# Patient Record
Sex: Male | Born: 1970
Health system: Southern US, Community
[De-identification: ages and names within clinical notes are randomized; demographics above are authoritative.]

## PROBLEM LIST (undated history)

## (undated) DIAGNOSIS — E119 Type 2 diabetes mellitus without complications: Secondary | ICD-10-CM

---

## 2004-10-01 ENCOUNTER — Emergency Department (HOSPITAL_COMMUNITY): Admission: EM | Admit: 2004-10-01 | Discharge: 2004-10-01 | Payer: Self-pay | Admitting: Emergency Medicine

## 2005-10-21 ENCOUNTER — Emergency Department (HOSPITAL_COMMUNITY): Admission: EM | Admit: 2005-10-21 | Discharge: 2005-10-21 | Payer: Self-pay | Admitting: Emergency Medicine

## 2010-09-13 ENCOUNTER — Other Ambulatory Visit: Payer: Self-pay | Admitting: Specialist

## 2010-09-13 ENCOUNTER — Ambulatory Visit
Admission: RE | Admit: 2010-09-13 | Discharge: 2010-09-13 | Disposition: A | Discharge status: Home / Self Care | Payer: BC Managed Care – PPO | Source: Ambulatory Visit | Attending: Specialist | Admitting: Specialist

## 2010-09-13 DIAGNOSIS — M549 Dorsalgia, unspecified: Secondary | ICD-10-CM

## 2011-05-01 ENCOUNTER — Emergency Department (INDEPENDENT_AMBULATORY_CARE_PROVIDER_SITE_OTHER)
Admission: EM | Admit: 2011-05-01 | Discharge: 2011-05-01 | Disposition: A | Discharge status: Home / Self Care | Payer: BC Managed Care – PPO | Source: Home / Self Care | Attending: Emergency Medicine | Admitting: Emergency Medicine

## 2011-05-01 ENCOUNTER — Encounter (HOSPITAL_COMMUNITY): Payer: Self-pay

## 2011-05-01 DIAGNOSIS — J329 Chronic sinusitis, unspecified: Secondary | ICD-10-CM

## 2011-05-01 MED ORDER — DOXYCYCLINE HYCLATE 100 MG PO CAPS: 100.0000 mg | ORAL_CAPSULE | Freq: Two times a day (BID) | ORAL | Status: AC

## 2011-05-01 MED ORDER — FLUTICASONE PROPIONATE 50 MCG/ACT NA SUSP: 2.0000 | Freq: Every day | NASAL | Status: AC

## 2011-05-01 MED ORDER — PSEUDOEPHEDRINE-GUAIFENESIN ER 120-1200 MG PO TB12: 1.0000 | ORAL_TABLET | Freq: Two times a day (BID) | ORAL | Status: DC

## 2011-05-01 MED ORDER — HYDROCODONE-ACETAMINOPHEN 7.5-500 MG/15ML PO SOLN: 5.0000 mL | Freq: Four times a day (QID) | ORAL | Status: AC | PRN

## 2011-05-01 MED ORDER — IBUPROFEN 600 MG PO TABS: 600.0000 mg | ORAL_TABLET | Freq: Four times a day (QID) | ORAL | Status: AC | PRN

## 2011-05-01 NOTE — ED Provider Notes (Signed)
History     CSN: [id number]  Arrival date & time 05/01/11  1811   First MD Initiated Contact with Patient 05/01/11 1837      Chief Complaint  Patient presents with  . Facial Pain    (Consider location/radiation/quality/duration/timing/severity/associated sxs/prior treatment) HPI Comments: Pt with nasal congestion, postnasal drip, ST, nonproductive cough, bilateral frontal sinus pain/pressure worse with bending forward/lying down x 5 days, ear fullness/pain. C/o bodyaches, fatigue, red itchy eyes, and eye crusted shut in am. Unable to sleep at night secondary to cough and nasal congestion. No fevers, N/V, other HA,  dental pain, purulent nasal d/c. Taking otc cold meds w/o relief. No known sick contacts. Had URI 2 weeks ago but states he got completely better from this.  ROS as noted in HPI. All other ROS negative.   The history is provided by the patient.    History reviewed. No pertinent past medical history.  History reviewed. No pertinent past surgical history.  History reviewed. No pertinent family history.  History  Substance Use Topics  . Smoking status: Never Smoker   . Smokeless tobacco: Not on file  . Alcohol Use: Yes      Review of Systems  Allergies  Review of patient's allergies indicates no known allergies.  Home Medications   Current Outpatient Rx  Name Route Sig Dispense Refill  . DOXYCYCLINE HYCLATE 100 MG PO CAPS Oral Take 1 capsule (100 mg total) by mouth 2 (two) times daily. X 7 days 14 capsule 0  . FLUTICASONE PROPIONATE 50 MCG/ACT NA SUSP Nasal Place 2 sprays into the nose daily. 16 g 0  . HYDROCODONE-ACETAMINOPHEN 7.5-500 MG/15ML PO SOLN Oral Take 5 mLs by mouth every 6 (six) hours as needed for pain. 120 mL 0  . IBUPROFEN 600 MG PO TABS Oral Take 1 tablet (600 mg total) by mouth every 6 (six) hours as needed for pain. 30 tablet 0  . PSEUDOEPHEDRINE-GUAIFENESIN ER [phone] MG PO TB12 Oral Take 1 tablet by mouth 2 (two) times daily. 20 each 0     BP 134/96  Pulse 70  Temp(Src) 98.9 F (37.2 C) (Oral)  Resp 11  SpO2 99%  Physical Exam  Nursing note and vitals reviewed. Constitutional: He is oriented to person, place, and time. He appears well-developed and well-nourished.  HENT:  Head: Normocephalic and atraumatic.  Right Ear: Hearing, tympanic membrane and ear canal normal.  Left Ear: Hearing, tympanic membrane and ear canal normal.  Nose: Mucosal edema and rhinorrhea present. No epistaxis. Right sinus exhibits frontal sinus tenderness. Right sinus exhibits no maxillary sinus tenderness. Left sinus exhibits frontal sinus tenderness. Left sinus exhibits no maxillary sinus tenderness.  Mouth/Throat: Uvula is midline and mucous membranes are normal. Posterior oropharyngeal erythema present. No oropharyngeal exudate.       Purulent nasal d/c  Eyes: Conjunctivae and EOM are normal. Pupils are equal, round, and reactive to light.  Neck: Normal range of motion. Neck supple.  Cardiovascular: Normal rate, regular rhythm and normal heart sounds.   Pulmonary/Chest: Effort normal and breath sounds normal. No respiratory distress.  Abdominal: Soft. Bowel sounds are normal. He exhibits no distension. There is no tenderness.  Musculoskeletal: Normal range of motion. He exhibits no edema and no tenderness.  Lymphadenopathy:    He has no cervical adenopathy.  Neurological: He is alert and oriented to person, place, and time.  Skin: Skin is warm and dry. No rash noted.  Psychiatric: He has a normal mood and affect. His behavior is normal.  Judgment and thought content normal.    ED Course  Procedures (including critical care time)  Labs Reviewed - No data to display No results found.   1. Sinusitis       MDM  No fevers >102, has had sx for < 10 days, no h/o double sickening- states got better from prev uri. Has purulent nasal d/c. Sending home with wait and see rx for doxy. Will start flonase, mucinex-d, increase fluids, nasal  saline irrigation,  tylenol/motrin prn pain. Discussed MDM and plan with pt. Pt agrees with plan and will f/u with PMD prn.     Luiz Blare, MD 05/01/11 2137

## 2011-05-01 NOTE — ED Notes (Signed)
C/o onset ~1 week ago of URI type stx, and having pain and fullness in face, yellow nasal secretions, hard to sleep at night due to cough and congestion; hurts al over

## 2019-06-21 DIAGNOSIS — L02215 Cutaneous abscess of perineum: Secondary | ICD-10-CM

## 2019-06-21 HISTORY — DX: Cutaneous abscess of perineum: L02.215

## 2019-07-04 ENCOUNTER — Observation Stay (HOSPITAL_COMMUNITY): Payer: Self-pay | Admitting: Certified Registered"

## 2019-07-04 ENCOUNTER — Encounter (HOSPITAL_COMMUNITY): Payer: Self-pay | Admitting: Emergency Medicine

## 2019-07-04 ENCOUNTER — Inpatient Hospital Stay (HOSPITAL_COMMUNITY)
Admission: EM | Admit: 2019-07-04 | Discharge: 2019-07-06 | DRG: 603 | Disposition: A | Discharge status: Home / Self Care | Payer: Self-pay | Attending: General Surgery | Admitting: General Surgery

## 2019-07-04 ENCOUNTER — Other Ambulatory Visit: Payer: Self-pay

## 2019-07-04 ENCOUNTER — Encounter (HOSPITAL_COMMUNITY): Admission: EM | Disposition: A | Discharge status: Home / Self Care | Payer: Self-pay | Source: Home / Self Care

## 2019-07-04 ENCOUNTER — Emergency Department (HOSPITAL_COMMUNITY): Payer: Self-pay

## 2019-07-04 DIAGNOSIS — R030 Elevated blood-pressure reading, without diagnosis of hypertension: Secondary | ICD-10-CM | POA: Diagnosis present

## 2019-07-04 DIAGNOSIS — L03317 Cellulitis of buttock: Secondary | ICD-10-CM | POA: Diagnosis present

## 2019-07-04 DIAGNOSIS — Z79899 Other long term (current) drug therapy: Secondary | ICD-10-CM

## 2019-07-04 DIAGNOSIS — B9562 Methicillin resistant Staphylococcus aureus infection as the cause of diseases classified elsewhere: Secondary | ICD-10-CM | POA: Diagnosis present

## 2019-07-04 DIAGNOSIS — Z20822 Contact with and (suspected) exposure to covid-19: Secondary | ICD-10-CM | POA: Diagnosis present

## 2019-07-04 DIAGNOSIS — E1165 Type 2 diabetes mellitus with hyperglycemia: Secondary | ICD-10-CM | POA: Diagnosis present

## 2019-07-04 DIAGNOSIS — L02215 Cutaneous abscess of perineum: Principal | ICD-10-CM | POA: Diagnosis present

## 2019-07-04 DIAGNOSIS — IMO0002 Reserved for concepts with insufficient information to code with codable children: Secondary | ICD-10-CM | POA: Diagnosis present

## 2019-07-04 DIAGNOSIS — L02214 Cutaneous abscess of groin: Secondary | ICD-10-CM | POA: Diagnosis present

## 2019-07-04 DIAGNOSIS — E785 Hyperlipidemia, unspecified: Secondary | ICD-10-CM | POA: Diagnosis present

## 2019-07-04 HISTORY — PX: INCISION AND DRAINAGE ABSCESS: SHX5864

## 2019-07-04 HISTORY — DX: Type 2 diabetes mellitus without complications: E11.9

## 2019-07-04 LAB — RESPIRATORY PANEL BY RT PCR (FLU A&B, COVID)
Influenza A by PCR: NEGATIVE
Influenza B by PCR: NEGATIVE
SARS Coronavirus 2 by RT PCR: NEGATIVE

## 2019-07-04 LAB — COMPREHENSIVE METABOLIC PANEL
ALT: 18 U/L (ref 0–44)
AST: 16 U/L (ref 15–41)
Albumin: 2.6 g/dL — ABNORMAL LOW (ref 3.5–5.0)
Alkaline Phosphatase: 115 U/L (ref 38–126)
Anion gap: 12 (ref 5–15)
BUN: 8 mg/dL (ref 6–20)
CO2: 26 mmol/L (ref 22–32)
Calcium: 8.9 mg/dL (ref 8.9–10.3)
Chloride: 94 mmol/L — ABNORMAL LOW (ref 98–111)
Creatinine, Ser: 0.82 mg/dL (ref 0.61–1.24)
GFR calc Af Amer: >60 mL/min (ref >60)
GFR calc non Af Amer: >60 mL/min (ref >60)
Glucose, Bld: 339 mg/dL — ABNORMAL HIGH (ref 70–99)
Potassium: 4.3 mmol/L (ref 3.5–5.1)
Sodium: 132 mmol/L — ABNORMAL LOW (ref 135–145)
Total Bilirubin: 1 mg/dL (ref 0.3–1.2)
Total Protein: 6.9 g/dL (ref 6.5–8.1)

## 2019-07-04 LAB — CBC WITH DIFFERENTIAL/PLATELET
Abs Immature Granulocytes: 0 10*3/uL (ref 0.00–0.07)
Band Neutrophils: 1 %
Basophils Absolute: 0 10*3/uL (ref 0.0–0.1)
Basophils Relative: 0 %
Eosinophils Absolute: 0.2 10*3/uL (ref 0.0–0.5)
Eosinophils Relative: 1 %
HCT: 44 % (ref 39.0–52.0)
Hemoglobin: 15.2 g/dL (ref 13.0–17.0)
Lymphocytes Relative: 10 %
Lymphs Abs: 1.5 10*3/uL (ref 0.7–4.0)
MCH: 34.2 pg — ABNORMAL HIGH (ref 26.0–34.0)
MCHC: 34.5 g/dL (ref 30.0–36.0)
MCV: 98.9 fL (ref 80.0–100.0)
Monocytes Absolute: 1.4 10*3/uL — ABNORMAL HIGH (ref 0.1–1.0)
Monocytes Relative: 9 %
Neutro Abs: 12.2 10*3/uL — ABNORMAL HIGH (ref 1.7–7.7)
Neutrophils Relative %: 79 %
Platelets: 204 10*3/uL (ref 150–400)
RBC: 4.45 MIL/uL (ref 4.22–5.81)
RDW: 11.6 % (ref 11.5–15.5)
WBC: 15.2 10*3/uL — ABNORMAL HIGH (ref 4.0–10.5)
nRBC: 0 % (ref 0.0–0.2)
nRBC: 0 /100 WBC

## 2019-07-04 LAB — HIV ANTIBODY (ROUTINE TESTING W REFLEX): HIV Screen 4th Generation wRfx: NONREACTIVE

## 2019-07-04 LAB — CBG MONITORING, ED: Glucose-Capillary: 308 mg/dL — ABNORMAL HIGH (ref 70–99)

## 2019-07-04 LAB — GLUCOSE, CAPILLARY: Glucose-Capillary: 202 mg/dL — ABNORMAL HIGH (ref 70–99)

## 2019-07-04 SURGERY — INCISION AND DRAINAGE, ABSCESS
Anesthesia: General | Laterality: Left

## 2019-07-04 MED ORDER — ACETAMINOPHEN 500 MG PO TABS
1000.0000 mg | ORAL_TABLET | Freq: Four times a day (QID) | ORAL | Status: DC
  Administered 2019-07-05 (×5): 1000 mg via ORAL
  Administered 2019-07-06: 500 mg via ORAL
  Administered 2019-07-06: 1000 mg via ORAL
  Filled 2019-07-04 (×7): qty 2

## 2019-07-04 MED ORDER — SUGAMMADEX SODIUM 200 MG/2ML IV SOLN
INTRAVENOUS | Status: DC | PRN
  Administered 2019-07-04: 200 mg via INTRAVENOUS

## 2019-07-04 MED ORDER — INSULIN ASPART 100 UNIT/ML ~~LOC~~ SOLN
0.0000 [IU] | Freq: Every day | SUBCUTANEOUS | Status: DC
  Administered 2019-07-04: 2 [IU] via SUBCUTANEOUS

## 2019-07-04 MED ORDER — DIPHENHYDRAMINE HCL 50 MG/ML IJ SOLN: 25.0000 mg | Freq: Four times a day (QID) | INTRAMUSCULAR | Status: DC | PRN

## 2019-07-04 MED ORDER — HYDRALAZINE HCL 20 MG/ML IJ SOLN: 10.0000 mg | INTRAMUSCULAR | Status: DC | PRN

## 2019-07-04 MED ORDER — HYDROMORPHONE HCL 1 MG/ML IJ SOLN
0.5000 mg | INTRAMUSCULAR | Status: DC | PRN
  Administered 2019-07-05 – 2019-07-06 (×2): 0.5 mg via INTRAVENOUS
  Filled 2019-07-04 (×2): qty 1

## 2019-07-04 MED ORDER — LACTATED RINGERS IV SOLN: INTRAVENOUS | Status: DC | PRN

## 2019-07-04 MED ORDER — INSULIN ASPART 100 UNIT/ML ~~LOC~~ SOLN
0.0000 [IU] | Freq: Three times a day (TID) | SUBCUTANEOUS | Status: DC
  Administered 2019-07-05: 5 [IU] via SUBCUTANEOUS
  Administered 2019-07-05 (×2): 3 [IU] via SUBCUTANEOUS
  Administered 2019-07-06: 5 [IU] via SUBCUTANEOUS
  Administered 2019-07-06: 3 [IU] via SUBCUTANEOUS

## 2019-07-04 MED ORDER — METOPROLOL TARTRATE 5 MG/5ML IV SOLN: 5.0000 mg | Freq: Four times a day (QID) | INTRAVENOUS | Status: DC | PRN

## 2019-07-04 MED ORDER — DIPHENHYDRAMINE HCL 25 MG PO CAPS: 25.0000 mg | ORAL_CAPSULE | Freq: Four times a day (QID) | ORAL | Status: DC | PRN

## 2019-07-04 MED ORDER — FENTANYL CITRATE (PF) 100 MCG/2ML IJ SOLN
25.0000 ug | INTRAMUSCULAR | Status: DC | PRN
  Administered 2019-07-04 (×2): 50 ug via INTRAVENOUS

## 2019-07-04 MED ORDER — FENTANYL CITRATE (PF) 250 MCG/5ML IJ SOLN
INTRAMUSCULAR | Status: DC | PRN
  Administered 2019-07-04 (×2): 100 ug via INTRAVENOUS
  Administered 2019-07-04: 50 ug via INTRAVENOUS

## 2019-07-04 MED ORDER — SUCCINYLCHOLINE CHLORIDE 200 MG/10ML IV SOSY
PREFILLED_SYRINGE | INTRAVENOUS | Status: AC
  Filled 2019-07-04: qty 10

## 2019-07-04 MED ORDER — PIPERACILLIN-TAZOBACTAM 3.375 G IVPB 30 MIN
3.3750 g | Freq: Once | INTRAVENOUS | Status: AC
  Administered 2019-07-04: 3.375 g via INTRAVENOUS
  Filled 2019-07-04: qty 50

## 2019-07-04 MED ORDER — ACETAMINOPHEN 10 MG/ML IV SOLN: 1000.0000 mg | Freq: Once | INTRAVENOUS | Status: DC | PRN

## 2019-07-04 MED ORDER — MIDAZOLAM HCL 2 MG/2ML IJ SOLN
INTRAMUSCULAR | Status: AC
  Filled 2019-07-04: qty 2

## 2019-07-04 MED ORDER — DOCUSATE SODIUM 100 MG PO CAPS
100.0000 mg | ORAL_CAPSULE | Freq: Two times a day (BID) | ORAL | Status: DC
  Administered 2019-07-04 – 2019-07-06 (×4): 100 mg via ORAL
  Filled 2019-07-04 (×4): qty 1

## 2019-07-04 MED ORDER — BISACODYL 10 MG RE SUPP: 10.0000 mg | Freq: Every day | RECTAL | Status: DC | PRN

## 2019-07-04 MED ORDER — FENTANYL CITRATE (PF) 250 MCG/5ML IJ SOLN
INTRAMUSCULAR | Status: AC
  Filled 2019-07-04: qty 5

## 2019-07-04 MED ORDER — ROCURONIUM BROMIDE 10 MG/ML (PF) SYRINGE
PREFILLED_SYRINGE | INTRAVENOUS | Status: AC
Start: 2019-07-04 — End: ?
  Filled 2019-07-04: qty 10

## 2019-07-04 MED ORDER — LIDOCAINE 2% (20 MG/ML) 5 ML SYRINGE
INTRAMUSCULAR | Status: AC
  Filled 2019-07-04: qty 5

## 2019-07-04 MED ORDER — ROCURONIUM BROMIDE 100 MG/10ML IV SOLN
INTRAVENOUS | Status: DC | PRN
  Administered 2019-07-04: 60 mg via INTRAVENOUS

## 2019-07-04 MED ORDER — ACETAMINOPHEN 160 MG/5ML PO SOLN: 1000.0000 mg | Freq: Once | ORAL | Status: DC | PRN

## 2019-07-04 MED ORDER — ONDANSETRON HCL 4 MG/2ML IJ SOLN
INTRAMUSCULAR | Status: DC | PRN
  Administered 2019-07-04: 4 mg via INTRAVENOUS

## 2019-07-04 MED ORDER — OXYCODONE HCL 5 MG PO TABS
5.0000 mg | ORAL_TABLET | ORAL | Status: DC | PRN
  Administered 2019-07-04 – 2019-07-06 (×9): 10 mg via ORAL
  Filled 2019-07-04 (×9): qty 2

## 2019-07-04 MED ORDER — PHENYLEPHRINE 40 MCG/ML (10ML) SYRINGE FOR IV PUSH (FOR BLOOD PRESSURE SUPPORT)
PREFILLED_SYRINGE | INTRAVENOUS | Status: AC
  Filled 2019-07-04: qty 10

## 2019-07-04 MED ORDER — PIPERACILLIN-TAZOBACTAM 3.375 G IVPB
3.3750 g | Freq: Three times a day (TID) | INTRAVENOUS | Status: DC
  Administered 2019-07-04 – 2019-07-06 (×6): 3.375 g via INTRAVENOUS
  Filled 2019-07-04 (×5): qty 50

## 2019-07-04 MED ORDER — ACETAMINOPHEN 500 MG PO TABS: 1000.0000 mg | ORAL_TABLET | Freq: Once | ORAL | Status: DC | PRN

## 2019-07-04 MED ORDER — PROPOFOL 10 MG/ML IV BOLUS
INTRAVENOUS | Status: AC
  Filled 2019-07-04: qty 20

## 2019-07-04 MED ORDER — LIDOCAINE HCL (CARDIAC) PF 100 MG/5ML IV SOSY
PREFILLED_SYRINGE | INTRAVENOUS | Status: DC | PRN
  Administered 2019-07-04: 60 mg via INTRATRACHEAL

## 2019-07-04 MED ORDER — ENOXAPARIN SODIUM 40 MG/0.4ML ~~LOC~~ SOLN
40.0000 mg | SUBCUTANEOUS | Status: DC
  Administered 2019-07-05 – 2019-07-06 (×2): 40 mg via SUBCUTANEOUS
  Filled 2019-07-04 (×2): qty 0.4

## 2019-07-04 MED ORDER — OXYCODONE HCL 5 MG/5ML PO SOLN: 5.0000 mg | Freq: Once | ORAL | Status: DC | PRN

## 2019-07-04 MED ORDER — SODIUM CHLORIDE 0.9 % IV SOLN: INTRAVENOUS | Status: DC

## 2019-07-04 MED ORDER — SODIUM CHLORIDE 0.9 % IV BOLUS
1000.0000 mL | Freq: Once | INTRAVENOUS | Status: AC
  Administered 2019-07-04: 1000 mL via INTRAVENOUS

## 2019-07-04 MED ORDER — MIDAZOLAM HCL 2 MG/2ML IJ SOLN
INTRAMUSCULAR | Status: AC
Start: 2019-07-04 — End: ?
  Filled 2019-07-04: qty 2

## 2019-07-04 MED ORDER — ACETAMINOPHEN 10 MG/ML IV SOLN
INTRAVENOUS | Status: AC
  Filled 2019-07-04: qty 100

## 2019-07-04 MED ORDER — ACETAMINOPHEN 10 MG/ML IV SOLN
INTRAVENOUS | Status: DC | PRN
  Administered 2019-07-04: 1000 mg via INTRAVENOUS

## 2019-07-04 MED ORDER — ONDANSETRON 4 MG PO TBDP: 4.0000 mg | ORAL_TABLET | Freq: Four times a day (QID) | ORAL | Status: DC | PRN

## 2019-07-04 MED ORDER — PROPOFOL 10 MG/ML IV BOLUS
INTRAVENOUS | Status: DC | PRN
  Administered 2019-07-04: 160 mg via INTRAVENOUS

## 2019-07-04 MED ORDER — KETOROLAC TROMETHAMINE 15 MG/ML IJ SOLN
15.0000 mg | Freq: Four times a day (QID) | INTRAMUSCULAR | Status: DC | PRN
  Administered 2019-07-05 – 2019-07-06 (×2): 15 mg via INTRAVENOUS
  Filled 2019-07-04 (×3): qty 1

## 2019-07-04 MED ORDER — IOHEXOL 300 MG/ML  SOLN
100.0000 mL | Freq: Once | INTRAMUSCULAR | Status: AC | PRN
  Administered 2019-07-04: 100 mL via INTRAVENOUS

## 2019-07-04 MED ORDER — OXYCODONE HCL 5 MG PO TABS: 5.0000 mg | ORAL_TABLET | Freq: Once | ORAL | Status: DC | PRN

## 2019-07-04 MED ORDER — ONDANSETRON HCL 4 MG/2ML IJ SOLN
INTRAMUSCULAR | Status: AC
  Filled 2019-07-04: qty 2

## 2019-07-04 MED ORDER — ONDANSETRON HCL 4 MG/2ML IJ SOLN: 4.0000 mg | Freq: Four times a day (QID) | INTRAMUSCULAR | Status: DC | PRN

## 2019-07-04 MED ORDER — MIDAZOLAM HCL 2 MG/2ML IJ SOLN
INTRAMUSCULAR | Status: DC | PRN
  Administered 2019-07-04: 2 mg via INTRAVENOUS

## 2019-07-04 MED ORDER — FENTANYL CITRATE (PF) 100 MCG/2ML IJ SOLN
INTRAMUSCULAR | Status: AC
  Filled 2019-07-04: qty 2

## 2019-07-04 MED ORDER — MORPHINE SULFATE (PF) 2 MG/ML IV SOLN
2.0000 mg | INTRAVENOUS | Status: DC | PRN
  Administered 2019-07-04: 2 mg via INTRAVENOUS
  Filled 2019-07-04: qty 1

## 2019-07-04 SURGICAL SUPPLY — 27 items
BLADE CLIPPER SURG (BLADE) IMPLANT
BNDG GAUZE ELAST 4 BULKY (GAUZE/BANDAGES/DRESSINGS) ×2 IMPLANT
CANISTER SUCT 3000ML PPV (MISCELLANEOUS) ×3 IMPLANT
COVER SURGICAL LIGHT HANDLE (MISCELLANEOUS) ×3 IMPLANT
COVER WAND RF STERILE (DRAPES) ×1 IMPLANT
DRAIN PENROSE 1/2X12 LTX STRL (WOUND CARE) ×2 IMPLANT
DRAPE LAPAROSCOPIC ABDOMINAL (DRAPES) ×2 IMPLANT
DRAPE LAPAROTOMY 100X72 PEDS (DRAPES) IMPLANT
DRSG PAD ABDOMINAL 8X10 ST (GAUZE/BANDAGES/DRESSINGS) ×2 IMPLANT
ELECT REM PT RETURN 9FT ADLT (ELECTROSURGICAL) ×3
ELECTRODE REM PT RTRN 9FT ADLT (ELECTROSURGICAL) ×1 IMPLANT
GAUZE SPONGE 4X4 12PLY STRL (GAUZE/BANDAGES/DRESSINGS) ×2 IMPLANT
GLOVE BIO SURGEON STRL SZ 6 (GLOVE) ×3 IMPLANT
GLOVE INDICATOR 6.5 STRL GRN (GLOVE) ×3 IMPLANT
GOWN STRL REUS W/ TWL LRG LVL3 (GOWN DISPOSABLE) ×2 IMPLANT
GOWN STRL REUS W/TWL LRG LVL3 (GOWN DISPOSABLE) ×6
KIT BASIN OR (CUSTOM PROCEDURE TRAY) ×3 IMPLANT
KIT TURNOVER KIT B (KITS) ×3 IMPLANT
NS IRRIG 1000ML POUR BTL (IV SOLUTION) ×3 IMPLANT
PACK GENERAL/GYN (CUSTOM PROCEDURE TRAY) ×3 IMPLANT
PAD ARMBOARD 7.5X6 YLW CONV (MISCELLANEOUS) ×3 IMPLANT
PENCIL SMOKE EVACUATOR (MISCELLANEOUS) ×3 IMPLANT
SUT ETHILON 3 0 FSL (SUTURE) ×2 IMPLANT
SWAB COLLECTION DEVICE MRSA (MISCELLANEOUS) IMPLANT
SWAB CULTURE ESWAB REG 1ML (MISCELLANEOUS) IMPLANT
TOWEL GREEN STERILE (TOWEL DISPOSABLE) ×3 IMPLANT
TOWEL GREEN STERILE FF (TOWEL DISPOSABLE) ×3 IMPLANT

## 2019-07-04 NOTE — Op Note (Signed)
Operative Note  Mitsuo Budnick  [id number]  [id number]  07/04/2019   Surgeon: Leeroy Bock A ConnorMD  Procedure performed: Incision and debridement/drainage of left perineal abscess  Preop diagnosis: Left perineal abscess and significant cellulitis from soft tissue infection Post-op diagnosis/intraop findings: Same, no evidence of necrotizing infection  Specimens: Cultures Retained items: Penrose through the wound as well as Kerlix packing EBL: Minimal cc Complications: none  Description of procedure: After obtaining informed consent the patient was taken to the operating room and placed supine on operating room table wheregeneral endotracheal anesthesia was initiated, preoperative antibiotics were administered, SCDs applied, and a formal timeout was performed.  He was transitioned to the dorsal lithotomy position with all pressure points appropriately padded.  The perineum and bilateral medial thighs were prepped and draped in usual sterile fashion.  Fluctuant area in the left groin/perineum already had 2 areas which were draining purulent fluid, both of these were incised sharply and purulent fluid was drained.  This was cultured.  The wound was bluntly probed and the tissues surrounding the abscess appeared to have good integrity with no evidence of necrotizing infection. All loculations were evacuated.  The wound was then irrigated with warm sterile saline.  A 1 inch Penrose was threaded through the abscess cavity, exiting the 2 incisions and sutured to itself.  A damp kerlix packing was then placed in the wound.  Dry gauze, ABD dressings were then applied. The patient was then returned to the supine position, awakened, extubated and taken to PACU in stable condition.   All counts were correct at the completion of the case.

## 2019-07-04 NOTE — Transfer of Care (Signed)
Immediate Anesthesia Transfer of Care Note  Patient: Cody Thompson  Procedure(s) Performed: INCISION AND DRAINAGE  LEFT GROIN ABSCESS (Left )  Patient Location: PACU  Anesthesia Type:General  Level of Consciousness: awake and alert   Airway & Oxygen Therapy: Patient Spontanous Breathing  Post-op Assessment: Report given to RN and Post -op Vital signs reviewed and stable  Post vital signs: Reviewed and stable  Last Vitals:  Vitals Value Taken Time  BP 122/83 07/04/19 2018  Temp    Pulse 104 07/04/19 2019  Resp 27 07/04/19 2019  SpO2 93 % 07/04/19 2019  Vitals shown include unvalidated device data.  Last Pain:  Vitals:   07/04/19 1525  TempSrc:   PainSc: 10-Worst pain ever         Complications: No apparent anesthesia complications

## 2019-07-04 NOTE — ED Notes (Signed)
Patient transported to CT 

## 2019-07-04 NOTE — ED Triage Notes (Signed)
C/o abscess to groin that is gradually getting worse.

## 2019-07-04 NOTE — ED Provider Notes (Addendum)
[facility] Provider Note   CSN: [id number] Arrival date & time: 07/04/19  1454     History Chief Complaint  Patient presents with  . Groin Pain    Cody Thompson is a 49 y.o. male.  Left groin and perineal abscess for several days, getting worse.  Wound is now draining.  No fever or chills.  No known history of diabetes.  Severity is moderate.  Palpation makes pain worse        History reviewed. No pertinent past medical history.  There are no problems to display for this patient.   History reviewed. No pertinent surgical history.     No family history on file.  Social History   Tobacco Use  . Smoking status: Never Smoker  Substance Use Topics  . Alcohol use: Yes  . Drug use: No    Home Medications Prior to Admission medications   Medication Sig Start Date End Date Taking? Authorizing Provider  fluticasone (FLONASE) 50 MCG/ACT nasal spray Place 2 sprays into the nose daily. 05/01/11 04/30/12  Melynda Ripple, MD  Pseudoephedrine-Guaifenesin (MUCINEX D) [phone] MG TB12 Take 1 tablet by mouth 2 (two) times daily. 05/01/11   Melynda Ripple, MD    Allergies    Patient has no known allergies.  Review of Systems   Review of Systems  All other systems reviewed and are negative.   Physical Exam Updated Vital Signs BP (!) 140/96 (BP Location: Right Arm)   Pulse (!) 120   Temp 99.9 F (37.7 C) (Oral)   Resp 18   SpO2 99%   Physical Exam Vitals and nursing note reviewed.  Constitutional:      Appearance: He is well-developed.  HENT:     Head: Normocephalic and atraumatic.  Eyes:     Conjunctiva/sclera: Conjunctivae normal.  Cardiovascular:     Rate and Rhythm: Normal rate and regular rhythm.  Pulmonary:     Effort: Pulmonary effort is normal.     Breath sounds: Normal breath sounds.  Abdominal:     General: Bowel sounds are normal.     Palpations: Abdomen is soft.  Genitourinary:    Comments: Obvious  extensive erythema, edema, induration left medial inguinal area and perineum; draining serous/bloody drainage Musculoskeletal:        General: Normal range of motion.     Cervical back: Neck supple.  Skin:    General: Skin is warm and dry.  Neurological:     General: No focal deficit present.     Mental Status: He is alert and oriented to person, place, and time.  Psychiatric:        Behavior: Behavior normal.     ED Results / Procedures / Treatments   Labs (all labs ordered are listed, but only abnormal results are displayed) Labs Reviewed  RESPIRATORY PANEL BY RT PCR (FLU A&B, COVID)  CBC WITH DIFFERENTIAL/PLATELET  COMPREHENSIVE METABOLIC PANEL  CBG MONITORING, ED    EKG None  Radiology No results found.  Procedures Procedures (including critical care time)  Medications Ordered in ED Medications  piperacillin-tazobactam (ZOSYN) IVPB 3.375 g (3.375 g Intravenous New Bag/Given 07/04/19 1619)  sodium chloride 0.9 % bolus 1,000 mL (1,000 mLs Intravenous New Bag/Given 07/04/19 1613)    ED Course  I have reviewed the triage vital signs and the nursing notes.  Pertinent labs & imaging results that were available during my care of the patient were reviewed by me and considered in my medical decision making (see  chart for details).    MDM Rules/Calculators/A&P                      Suspect inguinal and perineal abscess.  CT abdomen and pelvis.  Intravenous piperacillin.  Consult general surgery.   1625:  Disc c Dr Fredricka Bonine  CRITICAL CARE Performed by: Donnetta Hutching Total critical care time: 30 minutes Critical care time was exclusive of separately billable procedures and treating other patients. Critical care was necessary to treat or prevent imminent or life-threatening deterioration. Critical care was time spent personally by me on the following activities: development of treatment plan with patient and/or surrogate as well as nursing, discussions with consultants,  evaluation of patient's response to treatment, examination of patient, obtaining history from patient or surrogate, ordering and performing treatments and interventions, ordering and review of laboratory studies, ordering and review of radiographic studies, pulse oximetry and re-evaluation of patient's condition. Final Clinical Impression(s) / ED Diagnoses Final diagnoses:  Perineal abscess    Rx / DC Orders ED Discharge Orders    None       Donnetta Hutching, MD 07/04/19 1600    Donnetta Hutching, MD 07/04/19 1620

## 2019-07-04 NOTE — H&P (Signed)
Surgical H&P Requesting provider: Dr. Lacinda Axon  Chief Complaint: groin pain  HPI: Otherwise healthy 49 year old man who presents to the ER today with 2 days of swelling and pain in the left groin.  Yesterday he tried to express the abscess himself, got a small amount of fluid but continued to have evolving erythema, edema and pain in the left groin extending down the left thigh.  Wound continues to drain.  Denies fever.  Denies any known history of diabetes.  He has had this once before in the same spot but was able to drain the infection himself and it resolved.  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  No Known Allergies  History reviewed. No pertinent past medical history.  History reviewed. No pertinent surgical history.  No family history on file.  Social History   Socioeconomic History  . Marital status: Married    Spouse name: Not on file  . Number of children: Not on file  . Years of education: Not on file  . Highest education level: Not on file  Occupational History  . Not on file  Tobacco Use  . Smoking status: Never Smoker  Substance and Sexual Activity  . Alcohol use: Yes  . Drug use: No  . Sexual activity: Not on file  Other Topics Concern  . Not on file  Social History Narrative  . Not on file   Social Determinants of Health   Financial Resource Strain:   . Difficulty of Paying Living Expenses:   Food Insecurity:   . Worried About Charity fundraiser in the Last Year:   . Arboriculturist in the Last Year:   Transportation Needs:   . Film/video editor (Medical):   Marland Kitchen Lack of Transportation (Non-Medical):   Physical Activity:   . Days of Exercise per Week:   . Minutes of Exercise per Session:   Stress:   . Feeling of Stress :   Social Connections:   . Frequency of Communication with Friends and Family:   . Frequency of Social Gatherings with Friends and Family:   . Attends Religious Services:   . Active Member of Clubs or  Organizations:   . Attends Archivist Meetings:   Marland Kitchen Marital Status:     No current facility-administered medications on file prior to encounter.   Current Outpatient Medications on File Prior to Encounter  Medication Sig Dispense Refill  . fluticasone (FLONASE) 50 MCG/ACT nasal spray Place 2 sprays into the nose daily. 16 g 0  . Pseudoephedrine-Guaifenesin (MUCINEX D) [phone] MG TB12 Take 1 tablet by mouth 2 (two) times daily. 20 each 0    Review of Systems: a complete, 10pt review of systems was completed with pertinent positives and negatives as documented in the HPI  Physical Exam: Vitals:   07/04/19 1455  BP: (!) 140/96  Pulse: (!) 120  Resp: 18  Temp: 99.9 F (37.7 C)  SpO2: 99%   Gen: A&Ox3, no distress  Eyes: lids and conjunctivae normal, no icterus. Pupils equally round and reactive to light.  Neck: supple without mass or thyromegaly Chest: respiratory effort is normal. No crepitus or tenderness on palpation of the chest. Breath sounds equal.  Cardiovascular: RRR with palpable distal pulses, no pedal edema Gastrointestinal: soft, nondistended, nontender. No mass, hepatomegaly or splenomegaly.  Lymphatic: no lymphadenopathy in the neck or groin Muscoloskeletal: no clubbing or cyanosis of the fingers.  Strength is symmetrical throughout.   Neuro: cranial nerves grossly  intact.  Sensation intact to light touch diffusely. Psych: appropriate mood and affect, normal insight/judgment intact  Skin: warm and dry.  In the left groin medial to the inguinal crease there is tenderness, erythema, induration and fluctuance and drainage of sanguinous purulent fluid.  Cellulitis extends cephalocaudad and there is faint erythema along the medial thigh as well without induration   CBC Latest Ref Rng & Units 07/04/2019  WBC 4.0 - 10.5 K/uL 15.2(H)  Hemoglobin 13.0 - 17.0 g/dL 30.8  Hematocrit 56.9 - 52.0 % 44.0  Platelets 150 - 400 K/uL 204    No flowsheet data found.  No  results found for: INR, PROTIME  Imaging: No results found.   A/P: 49 year old gentleman with left groin abscess.  I recommend I&D in the operating room.  We will proceed once his Covid test is resulted.  Discussed with patient the procedure, risks of bleeding, injury to structures in the area, and the certainty of an open wound which will require packing changes over the coming weeks.  Small possibility of need for repeat debridement.  Of note his blood sugar is elevated, we will check hemoglobin A1c with morning labs.  ADMISSION STATUS: To be determined and if indicated, admission orders will be placed postoperatively.     Phylliss Blakes, MD [facility], [place]  See AMION to contact appropriate on-call provider

## 2019-07-04 NOTE — Anesthesia Procedure Notes (Signed)
Procedure Name: Intubation Date/Time: 07/04/2019 7:36 PM Performed by: Molli Hazard, CRNA Pre-anesthesia Checklist: Patient identified, Emergency Drugs available, Suction available and Patient being monitored Patient Re-evaluated:Patient Re-evaluated prior to induction Oxygen Delivery Method: Circle system utilized Preoxygenation: Pre-oxygenation with 100% oxygen Induction Type: IV induction, Rapid sequence and Cricoid Pressure applied Laryngoscope Size: Miller and 2 Grade View: Grade I Tube type: Oral Tube size: 8.0 mm Number of attempts: 1 Airway Equipment and Method: Stylet Placement Confirmation: ETT inserted through vocal cords under direct vision,  positive ETCO2 and breath sounds checked- equal and bilateral Secured at: 23 cm Tube secured with: Tape Dental Injury: Teeth and Oropharynx as per pre-operative assessment

## 2019-07-04 NOTE — ED Notes (Signed)
CBG collected. Result "[id number]." EDP, Charm Barges, notified.

## 2019-07-05 ENCOUNTER — Encounter: Payer: Self-pay | Admitting: *Deleted

## 2019-07-05 LAB — CBC
HCT: 39.4 % (ref 39.0–52.0)
Hemoglobin: 13.3 g/dL (ref 13.0–17.0)
MCH: 34 pg (ref 26.0–34.0)
MCHC: 33.8 g/dL (ref 30.0–36.0)
MCV: 100.8 fL — ABNORMAL HIGH (ref 80.0–100.0)
Platelets: 196 10*3/uL (ref 150–400)
RBC: 3.91 MIL/uL — ABNORMAL LOW (ref 4.22–5.81)
RDW: 11.8 % (ref 11.5–15.5)
WBC: 13.9 10*3/uL — ABNORMAL HIGH (ref 4.0–10.5)
nRBC: 0 % (ref 0.0–0.2)

## 2019-07-05 LAB — LIPID PANEL
Cholesterol: 148 mg/dL (ref 0–200)
HDL: 21 mg/dL — ABNORMAL LOW (ref >40)
LDL Cholesterol: 90 mg/dL (ref 0–99)
Total CHOL/HDL Ratio: 7 RATIO
Triglycerides: 183 mg/dL — ABNORMAL HIGH (ref <150)
VLDL: 37 mg/dL (ref 0–40)

## 2019-07-05 LAB — BASIC METABOLIC PANEL
Anion gap: 13 (ref 5–15)
BUN: 12 mg/dL (ref 6–20)
CO2: 25 mmol/L (ref 22–32)
Calcium: 8.2 mg/dL — ABNORMAL LOW (ref 8.9–10.3)
Chloride: 95 mmol/L — ABNORMAL LOW (ref 98–111)
Creatinine, Ser: 0.78 mg/dL (ref 0.61–1.24)
GFR calc Af Amer: >60 mL/min (ref >60)
GFR calc non Af Amer: >60 mL/min (ref >60)
Glucose, Bld: 239 mg/dL — ABNORMAL HIGH (ref 70–99)
Potassium: 4.3 mmol/L (ref 3.5–5.1)
Sodium: 133 mmol/L — ABNORMAL LOW (ref 135–145)

## 2019-07-05 LAB — GLUCOSE, CAPILLARY
Glucose-Capillary: 225 mg/dL — ABNORMAL HIGH (ref 70–99)
Glucose-Capillary: 168 mg/dL — ABNORMAL HIGH (ref 70–99)
Glucose-Capillary: 173 mg/dL — ABNORMAL HIGH (ref 70–99)
Glucose-Capillary: 200 mg/dL — ABNORMAL HIGH (ref 70–99)

## 2019-07-05 LAB — HEMOGLOBIN A1C
Hgb A1c MFr Bld: 8.8 % — ABNORMAL HIGH (ref 4.8–5.6)
Mean Plasma Glucose: 205.86 mg/dL

## 2019-07-05 MED ORDER — POLYETHYLENE GLYCOL 3350 17 G PO PACK
17.0000 g | PACK | Freq: Every day | ORAL | Status: DC
  Administered 2019-07-05 – 2019-07-06 (×2): 17 g via ORAL
  Filled 2019-07-05 (×2): qty 1

## 2019-07-05 MED ORDER — LISINOPRIL 5 MG PO TABS
5.0000 mg | ORAL_TABLET | Freq: Every day | ORAL | Status: DC
  Administered 2019-07-05 – 2019-07-06 (×2): 5 mg via ORAL
  Filled 2019-07-05 (×2): qty 1

## 2019-07-05 NOTE — Discharge Instructions (Addendum)
Your current A1c (Diabetes Level) is 8.8%. Goal is to get you down to a 7% or less (which is an average of 150 all the time when you check your glucose).   Diabetes Basics  Diabetes (diabetes mellitus) is a long-term (chronic) disease. It occurs when the body does not properly use sugar (glucose) that is released from food after you eat. Diabetes may be caused by one or both of these problems:  Your pancreas does not make enough of a hormone called insulin.  Your body does not react in a normal way to insulin that it makes. Insulin lets sugars (glucose) go into cells in your body. This gives you energy. If you have diabetes, sugars cannot get into cells. This causes high blood sugar (hyperglycemia). Follow these instructions at home: How is diabetes treated? You may need to take insulin or other diabetes medicines daily to keep your blood sugar in balance. Take your diabetes medicines every day as told by your doctor. List your diabetes medicines here: Diabetes medicines  Name of medicine: ______________________________ ? Amount (dose): _______________ Time (a.m./p.m.): _______________ Notes: ___________________________________  Name of medicine: ______________________________ ? Amount (dose): _______________ Time (a.m./p.m.): _______________ Notes: ___________________________________  Name of medicine: ______________________________ ? Amount (dose): _______________ Time (a.m./p.m.): _______________ Notes: ___________________________________ If you use insulin, you will learn how to give yourself insulin by injection. You may need to adjust the amount based on the food that you eat. List the types of insulin you use here: Insulin  Insulin type: ______________________________ ? Amount (dose): _______________ Time (a.m./p.m.): _______________ Notes: ___________________________________  Insulin type: ______________________________ ? Amount (dose): _______________ Time (a.m./p.m.):  _______________ Notes: ___________________________________  Insulin type: ______________________________ ? Amount (dose): _______________ Time (a.m./p.m.): _______________ Notes: ___________________________________  Insulin type: ______________________________ ? Amount (dose): _______________ Time (a.m./p.m.): _______________ Notes: ___________________________________  Insulin type: ______________________________ ? Amount (dose): _______________ Time (a.m./p.m.): _______________ Notes: ___________________________________ How do I manage my blood sugar?  Check your blood sugar levels using a blood glucose monitor as directed by your doctor. Your doctor will set treatment goals for you. Generally, you should have these blood sugar levels:  Before meals (preprandial): 80-130 mg/dL (1.6-1.04.4-7.2 mmol/L).  After meals (postprandial): below 180 mg/dL (10 mmol/L).  A1c level: less than 7%. Write down the times that you will check your blood sugar levels: Blood sugar checks  Time: _______________ Notes: ___________________________________  Time: _______________ Notes: ___________________________________  Time: _______________ Notes: ___________________________________  Time: _______________ Notes: ___________________________________  Time: _______________ Notes: ___________________________________  Time: _______________ Notes: ___________________________________  What do I need to know about low blood sugar? Low blood sugar is called hypoglycemia. This is when blood sugar is at or below 70 mg/dL (3.9 mmol/L). Symptoms may include:  Feeling: ? Hungry. ? Worried or nervous (anxious). ? Sweaty and clammy. ? Confused. ? Dizzy. ? Sleepy. ? Sick to your stomach (nauseous).  Having: ? A fast heartbeat. ? A headache. ? A change in your vision. ? Tingling or no feeling (numbness) around the mouth, lips, or tongue. ? Jerky movements that you cannot control (seizure).  Having trouble  with: ? Moving (coordination). ? Sleeping. ? Passing out (fainting). ? Getting upset easily (irritability). Treating low blood sugar To treat low blood sugar, eat or drink something sugary right away. If you can think clearly and swallow safely, follow the 15:15 rule:  Take 15 grams of a fast-acting carb (carbohydrate). Talk with your doctor about how much you should take.  Some fast-acting carbs are: ? Sugar tablets (glucose pills). Take 3-4 glucose  pills. ? 6-8 pieces of hard candy. ? 4-6 oz (120-150 mL) of fruit juice. ? 4-6 oz (120-150 mL) of regular (not diet) soda. ? 1 Tbsp (15 mL) honey or sugar.  Check your blood sugar 15 minutes after you take the carb.  If your blood sugar is still at or below 70 mg/dL (3.9 mmol/L), take 15 grams of a carb again.  If your blood sugar does not go above 70 mg/dL (3.9 mmol/L) after 3 tries, get help right away.  After your blood sugar goes back to normal, eat a meal or a snack within 1 hour. Treating very low blood sugar If your blood sugar is at or below 54 mg/dL (3 mmol/L), you have very low blood sugar (severe hypoglycemia). This is an emergency. Do not wait to see if the symptoms will go away. Get medical help right away. Call your local emergency services (911 in the [place] Do not drive yourself to the hospital. Questions to ask your health care provider  Do I need to meet with a diabetes educator?  What equipment will I need to care for myself at home?  What diabetes medicines do I need? When should I take them?  How often do I need to check my blood sugar?  What number can I call if I have questions?  When is my next doctor's visit?  Where can I find a support group for people with diabetes? Where to find more information  American Diabetes Association: www.diabetes.org  American Association of Diabetes Educators: www.diabeteseducator.org/patient-resources Contact a doctor if:  Your blood sugar is at or above 240 mg/dL  (74.1 mmol/L) for 2 days in a row.  You have been sick or have had a fever for 2 days or more, and you are not getting better.  You have any of these problems for more than 6 hours: ? You cannot eat or drink. ? You feel sick to your stomach (nauseous). ? You throw up (vomit). ? You have watery poop (diarrhea). Get help right away if:  Your blood sugar is lower than 54 mg/dL (3 mmol/L).  You get confused.  You have trouble: ? Thinking clearly. ? Breathing. Summary  Diabetes (diabetes mellitus) is a long-term (chronic) disease. It occurs when the body does not properly use sugar (glucose) that is released from food after digestion.  Take insulin and diabetes medicines as told.  Check your blood sugar every day, as often as told.  Keep all follow-up visits as told by your doctor. This is important. This information is not intended to replace advice given to you by your health care provider. Make sure you discuss any questions you have with your health care provider. Document Revised: 12/30/2018 Document Reviewed: 07/11/2017 Elsevier Patient Education  2020 [employer] Inc[employer]   Anorectal Abscess An abscess is an infected area that contains a collection of pus. An anorectal abscess is an abscess that is near the opening of the anus or around the rectum. Without treatment, an anorectal abscess can become larger and cause other problems, such as a more serious body-wide infection or pain, especially during bowel movements. What are the causes? This condition is caused by plugged glands or an infection in one of these areas:  The anus.  The area between the anus and the scrotum in males or between the anus and the vagina in females (perineum). What increases the risk? The following factors may make you more likely to develop this condition:  Diabetes or inflammatory bowel disease.  Having a body defense system (immune system) that is weak.  Engaging in anal sex.  Having a  sexually transmitted infection (STI).  Certain kinds of cancer, such as rectal carcinoma, leukemia, or lymphoma. What are the signs or symptoms? The main symptom of this condition is pain. The pain may be a throbbing pain that gets worse during bowel movements. Other symptoms include:  Swelling and redness in the area of the abscess. The redness may go beyond the abscess and appear as a red streak on the skin.  A visible, painful lump, or a lump that can be felt when touched.  Bleeding or pus-like discharge from the area.  Fever.  General weakness.  Constipation.  Diarrhea. How is this diagnosed? This condition is diagnosed based on your medical history and a physical exam of the affected area.  This may involve examining the rectal area with a gloved hand (digital rectal exam).  Sometimes, the health care provider needs to look into the rectum using a probe, scope, or imaging test.  For women, it may require a careful vaginal exam. How is this treated? Treatment for this condition may include:  Incision and drainage surgery. This involves making an incision over the abscess to drain the pus.  Medicines, including antibiotic medicine, pain medicine, stool softeners, or laxatives. Follow these instructions at home: Medicines  Take over-the-counter and prescription medicines only as told by your health care provider.  If you were prescribed an antibiotic medicine, use it as told by your health care provider. Do not stop using the antibiotic even if you start to feel better.  Do not drive or use heavy machinery while taking prescription pain medicine. Wound care   If gauze was used in the abscess, follow instructions from your health care provider about removing or changing the gauze. It can usually be removed in 2-3 days.  Wash your hands with soap and water before you remove or change your gauze. If soap and water are not available, use hand sanitizer.  If one or more  drains were placed in the abscess cavity, be careful not to pull at them. Your health care provider will tell you how long they need to remain in place.  Check your incision area every day for signs of infection. Check for: ? More redness, swelling, or pain. ? More fluid or blood. ? Warmth. ? Pus or a bad smell. Managing pain, stiffness, and swelling   Take a sitz bath 3-4 times a day and after bowel movements. This will help reduce pain and swelling.  To relieve pain, try sitting: ? On a heating pad with the setting on low. ? On an inflatable donut-shaped cushion.  If directed, put ice on the affected area: ? Put ice in a plastic bag. ? Place a towel between your skin and the bag. ? Leave the ice on for 20 minutes, 2-3 times a day. General instructions  Follow any diet instructions given by your health care provider.  Keep all follow-up visits as told by your health care provider. This is important. Contact a health care provider if you have:  Bleeding from your incision.  Pain, swelling, or redness that does not improve or gets worse.  Trouble passing stool or urine.  Symptoms that return after treatment. Get help right away if you:  Have problems moving or using your legs.  Have severe or increasing pain.  Have swelling in the affected area that suddenly gets worse.  Have a large increase in  bleeding or passing of pus.  Develop chills or a fever. Summary  An anorectal abscess is an abscess that is near the opening of the anus or around the rectum. An abscess is an infected area that contains a collection of pus.  The main symptom of this condition is pain. It may be a throbbing pain that gets worse during bowel movements.  Treatment for an anorectal abscess may include surgery to drain the pus from the abscess. Medicines and sitz baths may also be a part of your treatment plan. This information is not intended to replace advice given to you by your health care  provider. Make sure you discuss any questions you have with your health care provider. Document Revised: 05/15/2017 Document Reviewed: 05/15/2017 Elsevier Patient Education  2020 [employer].   How to Take a [employer] A sitz bath is a warm water bath that may be used to care for your rectum, genital area, or the area between your rectum and genitals (perineum). For a sitz bath, the water only comes up to your hips and covers your buttocks. A sitz bath may done at home in a bathtub or with a portable sitz bath that fits over the toilet. Your health care provider may recommend a sitz bath to help:  Relieve pain and discomfort after delivering a baby.  Relieve pain and itching from hemorrhoids or anal fissures.  Relieve pain after certain surgeries.  Relax muscles that are sore or tight. How to take a sitz bath Take 3-4 sitz baths a day, or as many as told by your health care provider. Bathtub sitz bath To take a sitz bath in a bathtub: 1. Partially fill a bathtub with warm water. The water should be deep enough to cover your hips and buttocks when you are sitting in the tub. 2. If your health care provider told you to put medicine in the water, follow his or her instructions. 3. Sit in the water. 4. Open the tub drain a little, and leave it open during your bath. 5. Turn on the warm water again, enough to replace the water that is draining out. Keep the water running throughout your bath. This helps keep the water at the right level and the right temperature. 6. Soak in the water for 15-20 minutes, or as long as told by your health care provider. 7. When you are done, be careful when you stand up. You may feel dizzy. 8. After the sitz bath, pat yourself dry. Do not rub your skin to dry it.  Over-the-toilet sitz bath To take a sitz bath with an over-the-toilet basin: 1. Follow the manufacturer's instructions. 2. Fill the basin with warm water. 3. If your health care provider told you  to put medicine in the water, follow his or her instructions. 4. Sit on the seat. Make sure the water covers your buttocks and perineum. 5. Soak in the water for 15-20 minutes, or as long as told by your health care provider. 6. After the sitz bath, pat yourself dry. Do not rub your skin to dry it. 7. Clean and dry the basin between uses. 8. Discard the basin if it cracks, or according to the manufacturer's instructions. Contact a health care provider if:  Your symptoms get worse. Do not continue with sitz baths if your symptoms get worse.  You have new symptoms. If this happens, do not continue with sitz baths until you talk with your health care provider. Summary  A sitz bath is  a warm water bath in which the water only comes up to your hips and covers your buttocks.  A sitz bath may help relieve itching, relieve pain, and relax muscles that are sore or tight in the lower part of your body, including your genital area.  Take 3-4 sitz baths a day, or as many as told by your health care provider. Soak in the water for 15-20 minutes.  Do not continue with sitz baths if your symptoms get worse. This information is not intended to replace advice given to you by your health care provider. Make sure you discuss any questions you have with your health care provider. Document Revised: 09/07/2018 Document Reviewed: 04/10/2017 Elsevier Patient Education  [employer].

## 2019-07-05 NOTE — Progress Notes (Signed)
Spanish interpreter offered. Patient deferred.   1 Day Post-Op  Subjective: CC: Pain Patient noting moderate to severe pain over area of incision. He reports he has tolerated solid foods since surgery without n/v. No abdominal pain. He has not mobilized. He has voided.   Sugars still high this am. No hx of dm. Lives at home with his brother.   Objective: Vital signs in last 24 hours: Temp:  [97.8 F (36.6 C)-99.9 F (37.7 C)] 99.1 F (37.3 C) (03/15 0356) Pulse Rate:  [91-120] 92 (03/15 0356) Resp:  [17-24] 18 (03/15 0356) BP: (121-140)/(82-99) 122/82 (03/15 0356) SpO2:  [92 %-99 %] 99 % (03/15 0356) Weight:  [99.8 kg] 99.8 kg (03/14 2020)    Intake/Output from previous day: 03/14 0701 - 03/15 0700 In: 2160 [P.O.:360; I.V.:750; IV Piggyback:1050] Out: 825 [Urine:800; Blood:25] Intake/Output this shift: No intake/output data recorded.  PE: Gen:  Alert, NAD, pleasant Card:  RRR Pulm:  CTAB, no W/R/R, effort normal Abd: Soft, NT/ND, +BS GU: NT chaperone present. In the left groin medial to the inguinal crease there noted I&D sites x2 w/ penrose in place, with area packed with kerlix dressing. No gross purulent drainage. External dressing saturated with bloody, SS like drainage and changed. Cellulitis extends cephalocaudad and there is faint erythema along the medial thigh as well without induration Ext:  No LE edema Psych: A&Ox3  Skin: no rashes noted, warm and dry   Lab Results:  Recent Labs    07/04/19 1624 07/05/19 0346  WBC 15.2* 13.9*  HGB 15.2 13.3  HCT 44.0 39.4  PLT 204 196   BMET Recent Labs    07/04/19 1624 07/05/19 0346  NA 132* 133*  K 4.3 4.3  CL 94* 95*  CO2 26 25  GLUCOSE 339* 239*  BUN 8 12  CREATININE 0.82 0.78  CALCIUM 8.9 8.2*   PT/INR No results for input(s): LABPROT, INR in the last 72 hours. CMP     Component Value Date/Time   NA 133 (L) 07/05/2019 0346   K 4.3 07/05/2019 0346   CL 95 (L) 07/05/2019 0346   CO2 25  07/05/2019 0346   GLUCOSE 239 (H) 07/05/2019 0346   BUN 12 07/05/2019 0346   CREATININE 0.78 07/05/2019 0346   CALCIUM 8.2 (L) 07/05/2019 0346   PROT 6.9 07/04/2019 1624   ALBUMIN 2.6 (L) 07/04/2019 1624   AST 16 07/04/2019 1624   ALT 18 07/04/2019 1624   ALKPHOS 115 07/04/2019 1624   BILITOT 1.0 07/04/2019 1624   GFRNONAA >60 07/05/2019 0346   GFRAA >60 07/05/2019 0346   Lipase  No results found for: LIPASE     Studies/Results: CT ABDOMEN PELVIS W CONTRAST  Result Date: 07/04/2019 CLINICAL DATA:  Worsening left groin abscess. EXAM: CT ABDOMEN AND PELVIS WITH CONTRAST TECHNIQUE: Multidetector CT imaging of the abdomen and pelvis was performed using the standard protocol following bolus administration of intravenous contrast. CONTRAST:  OMNIPAQUE IOHEXOL 300 MG/ML  SOLN COMPARISON:  None. FINDINGS: Lower chest: No significant pulmonary nodules or acute consolidative airspace disease. Hepatobiliary: Normal liver size. No liver mass. Normal gallbladder with no radiopaque cholelithiasis. No biliary ductal dilatation. Pancreas: Normal, with no mass or duct dilation. Spleen: Normal size. No mass. Adrenals/Urinary Tract: Normal adrenals. Subcentimeter hypodense renal cortical lesion in the posterior lower right kidney, too small to characterize, requiring no follow-up. Otherwise normal kidneys, with no hydronephrosis. Normal bladder. Stomach/Bowel: Normal non-distended stomach. Normal caliber small bowel with no small bowel wall thickening. Normal  appendix. Mild left colonic diverticulosis, with no large bowel wall thickening or significant pericolonic fat stranding. Vascular/Lymphatic: Normal caliber abdominal aorta. Patent portal, splenic, hepatic and renal veins. No pathologically enlarged abdominal lymph nodes. Mildly enlarged 1.0 cm left external iliac node (series 3/image 80). Mild left inguinal lymphadenopathy up to 1.3 cm (series 3/image 91). No right pelvic adenopathy. Reproductive:  Normal size prostate with nonspecific punctate internal prostatic calcification. Other: No pneumoperitoneum. No ascites. Subcutaneous anterior left perineal 4.6 x 2.0 x 4.0 cm abscess with thick enhancing wall and prominent surrounding fat stranding and associated skin thickening (series 3/image 107), with subcutaneous edema extending inferiorly into the medial left thigh. No definite soft tissue gas. Soft tissue edema appears to involve the fascia of the left adductor musculature in the medial left thigh (series 3/image 112). Musculoskeletal: No aggressive appearing focal osseous lesions. IMPRESSION: 1. Subcutaneous anterior left perineal 4.6 x 2.0 x 4.0 cm abscess with prominent surrounding cellulitis extending into the medial left thigh with apparent involvement of the fascia of the left adductor musculature in the medial left thigh suggesting fasciitis. No definite soft tissue gas to suggest necrotizing fasciitis. 2. Mild left colonic diverticulosis. Electronically Signed   By: Ilona Sorrel M.D.   On: 07/04/2019 17:46    Anti-infectives: Anti-infectives (From admission, onward)   Start     Dose/Rate Route Frequency Ordered Stop   07/04/19 2200  piperacillin-tazobactam (ZOSYN) IVPB 3.375 g     3.375 g 12.5 mL/hr over 240 Minutes Intravenous Every 8 hours 07/04/19 2129     07/04/19 1600  piperacillin-tazobactam (ZOSYN) IVPB 3.375 g     3.375 g 100 mL/hr over 30 Minutes Intravenous  Once 07/04/19 1557 07/04/19 1646       Assessment/Plan Hyperglycemia - SSI. Await A1c.  Left perineal abscess and significant cellulitis from soft tissue infection - S/p Incision and debridement/drainage of left perineal abscess w/ Penrose through the wound as well as Kerlix packing - Dr. Kae Heller - 07/04/2019  - POD #1 - Will remove packing later today and start sitz baths - Will outline area of cellulitis - Continue IV abx. Await Cx's - Mobilize - Pulm toilet   FEN - CM diet, IVF VTE - SCDs, Lovenox ID -  Zosyn 3/14 >>     LOS: 0 days    Jillyn Ledger , [facility] Surgery 07/05/2019, 8:14 AM Please see Amion for pager number during day hours 7:00am-4:30pm

## 2019-07-05 NOTE — Progress Notes (Addendum)
Inpatient Diabetes Program Recommendations  AACE/ADA: New Consensus Statement on Inpatient Glycemic Control (2015)  Target Ranges:  Prepandial:   less than 140 mg/dL      Peak postprandial:   less than 180 mg/dL (1-2 hours)      Critically ill patients:  140 - 180 mg/dL   Lab Results  Component Value Date   GLUCAP 225 (H) 07/05/2019   HGBA1C 8.8 (H) 07/05/2019    Review of Glycemic Control  Diabetes history: None/New Diagnosis this admission  Current orders for Inpatient glycemic control:  Novolog 0-15 units tid + hs  A1c 8.8% on 3/15  Inpatient Diabetes Program Recommendations:    New diagnosis this admission. Pt may benefit from Metformin 500 mg bid at time of d/c a long with a glucose meter. Pt will need close medical follow up. Noted no insurance will need TOC team to follow and set up follow up.  Addendum 1203 pm:  spoke with pt about new DM diagnosis. Pt has a family history in his mother. Discussed his current A1c and discussed glucose and A1c goals. Discussed pt possibly being on oral DM medications and taking them with food. Also discussed how and when to check his glucose levels. Spent some time discussing lifestyle modifications with diet and exercising. Stressed importance of glucose control on wound healing. Discussed importance of follow up.   Thanks,  Christena Deem RN, MSN, BC-ADM Inpatient Diabetes Coordinator Team Pager [phone] (8a-5p)

## 2019-07-05 NOTE — Consult Note (Addendum)
[facility] Medical Consultation  Cody Thompson PZW:[id number] DOB: 1970-08-28 DOA: 07/04/2019 PCP: Patient, No Pcp Per   Requesting physician: Dr. Rodolph Bong Date of consultation: 06/07/2019 Reason for consultation: New Onset DM  Impression/Recommendations Active Problems:   Hyperglycemia   Abscess of groin, left   New Onset DM A1C 8.8 Pt does not have PCP and has no insurance coverage Agreed with sliding scale for inpatient management for now Check Lipid panel Discussed with pt about diet control, weight loss and cut down calorie intake at bedside Recommend Amaryl and Metformin to discharge home with: Amaryl 2 mg po daily Metformin 500 mg BID  Left groin abscess s/p I&D As per primary team  Leukocytosis Secondary to groin abscess  Elevated BP without diagnose of HTN Probably related to Surgical pain Recheck when pain more controlled Add small dose of lisinopril 5 mg daily for kidney protection    I will followup again tomorrow. Please contact me if I can be of assistance in the meanwhile. Thank you for this consultation.  Chief Complaint: I feel fine  HPI:  49 year old without significant medical Hx came with 2 days of swelling and pain in the left groin. He manually opened the abscess one day before with small amount of clear fluid but continued to have evolving swelling and pain in the left groin extending down the left thigh.  Wound continues to drain.  Denies fever.  On admission, blood work showed elevated Glucose and A1C 8.8, he denies any known history of diabetes. No blurry vision, no numbness or tingling of any of his limbs. Review of Systems:  Reviews of all systems negative except those mentioned in HPI  Past Medical History:  Diagnosis Date  . Diabetes mellitus without complication ([place])   . Perineal abscess 06/2019   Past Surgical History:  Procedure Laterality Date  . INCISION AND DRAINAGE ABSCESS Left 07/04/2019   Procedure: INCISION AND  DRAINAGE  LEFT GROIN ABSCESS;  Surgeon: Clovis Riley, MD;  Location: [facility];  Service: General;  Laterality: Left;   Social History:  reports that he has never smoked. He has never used smokeless tobacco. He reports current alcohol use. He reports that he does not use drugs.  No Known Allergies History reviewed. No pertinent family history.  Prior to Admission medications   Medication Sig Start Date End Date Taking? Authorizing Provider  ibuprofen (ADVIL) 200 MG tablet Take 200 mg by mouth every 6 (six) hours as needed for moderate pain.   Yes [provider]  fluticasone (FLONASE) 50 MCG/ACT nasal spray Place 2 sprays into the nose daily. 05/01/11 04/30/12  Melynda Ripple, MD  Pseudoephedrine-Guaifenesin (MUCINEX D) [phone] MG TB12 Take 1 tablet by mouth 2 (two) times daily. Patient not taking: Reported on 07/04/2019 05/01/11   Melynda Ripple, MD   Physical Exam: Blood pressure 122/82, pulse 92, temperature 99.1 F (37.3 C), temperature source Oral, resp. rate 18, height 5\' 11"  (1.803 m), weight 99.8 kg, SpO2 99 %. Vitals:   07/04/19 2355 07/05/19 0356  BP:  122/82  Pulse:  92  Resp:  18  Temp: 97.8 F (36.6 C) 99.1 F (37.3 C)  SpO2:  99%     General:  Obese, no acute distress  Eyes: PERRL  ENT: Mucous membrane moist  Neck: Supple  Cardiovascular: RRR, S1/S2, no murmurs  Respiratory: Clear B/L no crackles  Abdomen: Soft  Skin: Surgical site covered with dressing, no bleeding or discharges  Musculoskeletal: Normal muscle tone  Psychiatric: Calm  Neurologic: No focal deficit  Labs on Admission:  Basic Metabolic Panel: Recent Labs  Lab 07/04/19 1624 07/05/19 0346  NA 132* 133*  K 4.3 4.3  CL 94* 95*  CO2 26 25  GLUCOSE 339* 239*  BUN 8 12  CREATININE 0.82 0.78  CALCIUM 8.9 8.2*   Liver Function Tests: Recent Labs  Lab 07/04/19 1624  AST 16  ALT 18  ALKPHOS 115  BILITOT 1.0  PROT 6.9  ALBUMIN 2.6*   No results for input(s):  LIPASE, AMYLASE in the last 168 hours. No results for input(s): AMMONIA in the last 168 hours. CBC: Recent Labs  Lab 07/04/19 1624 07/05/19 0346  WBC 15.2* 13.9*  NEUTROABS 12.2*  --   HGB 15.2 13.3  HCT 44.0 39.4  MCV 98.9 100.8*  PLT 204 196   Cardiac Enzymes: No results for input(s): CKTOTAL, CKMB, CKMBINDEX, TROPONINI in the last 168 hours. BNP: Invalid input(s): POCBNP CBG: Recent Labs  Lab 07/04/19 1642 07/04/19 2123 07/05/19 0816 07/05/19 1201  GLUCAP 308* 202* 225* 168*    Radiological Exams on Admission: CT ABDOMEN PELVIS W CONTRAST  Result Date: 07/04/2019 CLINICAL DATA:  Worsening left groin abscess. EXAM: CT ABDOMEN AND PELVIS WITH CONTRAST TECHNIQUE: Multidetector CT imaging of the abdomen and pelvis was performed using the standard protocol following bolus administration of intravenous contrast. CONTRAST:  OMNIPAQUE IOHEXOL 300 MG/ML  SOLN COMPARISON:  None. FINDINGS: Lower chest: No significant pulmonary nodules or acute consolidative airspace disease. Hepatobiliary: Normal liver size. No liver mass. Normal gallbladder with no radiopaque cholelithiasis. No biliary ductal dilatation. Pancreas: Normal, with no mass or duct dilation. Spleen: Normal size. No mass. Adrenals/Urinary Tract: Normal adrenals. Subcentimeter hypodense renal cortical lesion in the posterior lower right kidney, too small to characterize, requiring no follow-up. Otherwise normal kidneys, with no hydronephrosis. Normal bladder. Stomach/Bowel: Normal non-distended stomach. Normal caliber small bowel with no small bowel wall thickening. Normal appendix. Mild left colonic diverticulosis, with no large bowel wall thickening or significant pericolonic fat stranding. Vascular/Lymphatic: Normal caliber abdominal aorta. Patent portal, splenic, hepatic and renal veins. No pathologically enlarged abdominal lymph nodes. Mildly enlarged 1.0 cm left external iliac node (series 3/image 80). Mild left inguinal  lymphadenopathy up to 1.3 cm (series 3/image 91). No right pelvic adenopathy. Reproductive: Normal size prostate with nonspecific punctate internal prostatic calcification. Other: No pneumoperitoneum. No ascites. Subcutaneous anterior left perineal 4.6 x 2.0 x 4.0 cm abscess with thick enhancing wall and prominent surrounding fat stranding and associated skin thickening (series 3/image 107), with subcutaneous edema extending inferiorly into the medial left thigh. No definite soft tissue gas. Soft tissue edema appears to involve the fascia of the left adductor musculature in the medial left thigh (series 3/image 112). Musculoskeletal: No aggressive appearing focal osseous lesions. IMPRESSION: 1. Subcutaneous anterior left perineal 4.6 x 2.0 x 4.0 cm abscess with prominent surrounding cellulitis extending into the medial left thigh with apparent involvement of the fascia of the left adductor musculature in the medial left thigh suggesting fasciitis. No definite soft tissue gas to suggest necrotizing fasciitis. 2. Mild left colonic diverticulosis. Electronically Signed   By: Delbert Phenix M.D.   On: 07/04/2019 17:46    EKG: None  Time spent: 35 [facility] Pager [phone]   07/05/2019, 12:51 PM

## 2019-07-06 DIAGNOSIS — L02214 Cutaneous abscess of groin: Secondary | ICD-10-CM

## 2019-07-06 DIAGNOSIS — E1165 Type 2 diabetes mellitus with hyperglycemia: Secondary | ICD-10-CM

## 2019-07-06 LAB — GLUCOSE, CAPILLARY
Glucose-Capillary: 191 mg/dL — ABNORMAL HIGH (ref 70–99)
Glucose-Capillary: 226 mg/dL — ABNORMAL HIGH (ref 70–99)

## 2019-07-06 MED ORDER — DOCUSATE SODIUM 100 MG PO CAPS: 100.0000 mg | ORAL_CAPSULE | Freq: Two times a day (BID) | ORAL | 0 refills | Status: AC | PRN

## 2019-07-06 MED ORDER — ATORVASTATIN CALCIUM 10 MG PO TABS
20.0000 mg | ORAL_TABLET | Freq: Every day | ORAL | Status: DC
  Administered 2019-07-06: 20 mg via ORAL
  Filled 2019-07-06 (×2): qty 2

## 2019-07-06 MED ORDER — ACETAMINOPHEN 500 MG PO TABS: 1000.0000 mg | ORAL_TABLET | Freq: Four times a day (QID) | ORAL | 0 refills | Status: AC

## 2019-07-06 MED ORDER — ATORVASTATIN CALCIUM 20 MG PO TABS: 20.0000 mg | ORAL_TABLET | Freq: Every day | ORAL | 0 refills | Status: AC

## 2019-07-06 MED ORDER — OXYCODONE HCL 5 MG PO TABS: 5.0000 mg | ORAL_TABLET | Freq: Four times a day (QID) | ORAL | 0 refills | Status: AC | PRN

## 2019-07-06 MED ORDER — METFORMIN HCL 500 MG PO TABS: 500.0000 mg | ORAL_TABLET | Freq: Two times a day (BID) | ORAL | 2 refills | Status: AC

## 2019-07-06 MED ORDER — INSULIN DETEMIR 100 UNIT/ML ~~LOC~~ SOLN
5.0000 [IU] | Freq: Every day | SUBCUTANEOUS | Status: DC
  Filled 2019-07-06: qty 0.05

## 2019-07-06 MED ORDER — GLIPIZIDE 5 MG PO TABS: 5.0000 mg | ORAL_TABLET | Freq: Every day | ORAL | 2 refills | Status: AC

## 2019-07-06 MED ORDER — DOXYCYCLINE HYCLATE 100 MG PO CAPS: 100.0000 mg | ORAL_CAPSULE | Freq: Two times a day (BID) | ORAL | 0 refills | Status: AC

## 2019-07-06 MED ORDER — POLYETHYLENE GLYCOL 3350 17 G PO PACK: 17.0000 g | PACK | Freq: Every day | ORAL | 0 refills | Status: AC | PRN

## 2019-07-06 MED ORDER — LISINOPRIL 5 MG PO TABS: 5.0000 mg | ORAL_TABLET | Freq: Every day | ORAL | 2 refills | Status: AC

## 2019-07-06 MED FILL — OXYCODONE HCL 5 MG TABS: 5 | 4 days supply | Qty: 15 | Fill #0

## 2019-07-06 MED FILL — glipiZIDE 5 MG TABS: 5 | 30 days supply | Qty: 30 | Fill #0

## 2019-07-06 MED FILL — LISINOPRIL 5 MG TABS: 5 | 30 days supply | Qty: 30 | Fill #0

## 2019-07-06 MED FILL — ATORVASTATIN CALCIUM 20 MG: 20 | 30 days supply | Qty: 30 | Fill #0

## 2019-07-06 MED FILL — DOXYCYCLINE HYCLATE 100 MG: 100 | 7 days supply | Qty: 14 | Fill #0

## 2019-07-06 NOTE — Social Work (Signed)
Pt appointment made at [facility] for April 12th at 2:30pm. Added to AVS as f/u provider.   Octavio Graves, MSW, LCSW [facility] Clinical Social Work

## 2019-07-06 NOTE — Progress Notes (Signed)
2 Days Post-Op  Subjective: CC: Feels much better today. Says that he can move his LLE now without pain. He is tolerating his diet without n/v. No abdominal pain. Notes some pain of the left groin/perineum when packing was removed yesterday, but not much since that time. He plans to shower this morning. He is mobilizing without difficulty and tolerating dressing changes.   Objective: Vital signs in last 24 hours: Temp:  [98.4 F (36.9 C)-99.4 F (37.4 C)] 99.4 F (37.4 C) (03/16 0418) Pulse Rate:  [87-93] 88 (03/16 0418) Resp:  [17-20] 17 (03/16 0418) BP: (102-116)/(78-83) 102/78 (03/16 0418) SpO2:  [90 %-100 %] 100 % (03/16 0418) Last BM Date: 07/04/19  Intake/Output from previous day: 03/15 0701 - 03/16 0700 In: 2119.9 [P.O.:1580; I.V.:439.9; IV Piggyback:100] Out: 1400 [Urine:1400] Intake/Output this shift: No intake/output data recorded.  PE: Gen:  Alert, NAD, pleasant Card:  RRR Pulm:  CTAB, no W/R/R, effort normal Abd: Soft, NT/ND, +BS GU: In the left groin medial to the inguinal crease there noted I&D site as well as one of the left lateral perineum buttock w/ penrose in place. Some purulent drainage notes on dressing.Cellulitis extends cephalocaudad along the medial thigh. Area of cellulitis is improving from area outlined yesterday.  Ext:  No LE edema. Able to move LLE at all joints without pain.  Psych: A&Ox3  Skin: no rashes noted, warm and dry  Lab Results:  Recent Labs    07/04/19 1624 07/05/19 0346  WBC 15.2* 13.9*  HGB 15.2 13.3  HCT 44.0 39.4  PLT 204 196   BMET Recent Labs    07/04/19 1624 07/05/19 0346  NA 132* 133*  K 4.3 4.3  CL 94* 95*  CO2 26 25  GLUCOSE 339* 239*  BUN 8 12  CREATININE 0.82 0.78  CALCIUM 8.9 8.2*   PT/INR No results for input(s): LABPROT, INR in the last 72 hours. CMP     Component Value Date/Time   NA 133 (L) 07/05/2019 0346   K 4.3 07/05/2019 0346   CL 95 (L) 07/05/2019 0346   CO2 25 07/05/2019 0346   GLUCOSE 239 (H) 07/05/2019 0346   BUN 12 07/05/2019 0346   CREATININE 0.78 07/05/2019 0346   CALCIUM 8.2 (L) 07/05/2019 0346   PROT 6.9 07/04/2019 1624   ALBUMIN 2.6 (L) 07/04/2019 1624   AST 16 07/04/2019 1624   ALT 18 07/04/2019 1624   ALKPHOS 115 07/04/2019 1624   BILITOT 1.0 07/04/2019 1624   GFRNONAA >60 07/05/2019 0346   GFRAA >60 07/05/2019 0346   Lipase  No results found for: LIPASE     Studies/Results: CT ABDOMEN PELVIS W CONTRAST  Result Date: 07/04/2019 CLINICAL DATA:  Worsening left groin abscess. EXAM: CT ABDOMEN AND PELVIS WITH CONTRAST TECHNIQUE: Multidetector CT imaging of the abdomen and pelvis was performed using the standard protocol following bolus administration of intravenous contrast. CONTRAST:  OMNIPAQUE IOHEXOL 300 MG/ML  SOLN COMPARISON:  None. FINDINGS: Lower chest: No significant pulmonary nodules or acute consolidative airspace disease. Hepatobiliary: Normal liver size. No liver mass. Normal gallbladder with no radiopaque cholelithiasis. No biliary ductal dilatation. Pancreas: Normal, with no mass or duct dilation. Spleen: Normal size. No mass. Adrenals/Urinary Tract: Normal adrenals. Subcentimeter hypodense renal cortical lesion in the posterior lower right kidney, too small to characterize, requiring no follow-up. Otherwise normal kidneys, with no hydronephrosis. Normal bladder. Stomach/Bowel: Normal non-distended stomach. Normal caliber small bowel with no small bowel wall thickening. Normal appendix. Mild left colonic diverticulosis,  with no large bowel wall thickening or significant pericolonic fat stranding. Vascular/Lymphatic: Normal caliber abdominal aorta. Patent portal, splenic, hepatic and renal veins. No pathologically enlarged abdominal lymph nodes. Mildly enlarged 1.0 cm left external iliac node (series 3/image 80). Mild left inguinal lymphadenopathy up to 1.3 cm (series 3/image 91). No right pelvic adenopathy. Reproductive: Normal size prostate  with nonspecific punctate internal prostatic calcification. Other: No pneumoperitoneum. No ascites. Subcutaneous anterior left perineal 4.6 x 2.0 x 4.0 cm abscess with thick enhancing wall and prominent surrounding fat stranding and associated skin thickening (series 3/image 107), with subcutaneous edema extending inferiorly into the medial left thigh. No definite soft tissue gas. Soft tissue edema appears to involve the fascia of the left adductor musculature in the medial left thigh (series 3/image 112). Musculoskeletal: No aggressive appearing focal osseous lesions. IMPRESSION: 1. Subcutaneous anterior left perineal 4.6 x 2.0 x 4.0 cm abscess with prominent surrounding cellulitis extending into the medial left thigh with apparent involvement of the fascia of the left adductor musculature in the medial left thigh suggesting fasciitis. No definite soft tissue gas to suggest necrotizing fasciitis. 2. Mild left colonic diverticulosis. Electronically Signed   By: Ilona Sorrel M.D.   On: 07/04/2019 17:46    Anti-infectives: Anti-infectives (From admission, onward)   Start     Dose/Rate Route Frequency Ordered Stop   07/04/19 2200  piperacillin-tazobactam (ZOSYN) IVPB 3.375 g     3.375 g 12.5 mL/hr over 240 Minutes Intravenous Every 8 hours 07/04/19 2129     07/04/19 1600  piperacillin-tazobactam (ZOSYN) IVPB 3.375 g     3.375 g 100 mL/hr over 30 Minutes Intravenous  Once 07/04/19 1557 07/04/19 1646       Assessment/Plan New onset DM2- SSI while inpatient. A1c 8.8. Appreciate TRH and DM coordinator asssitance. Recommend Metformin and Amaryl at d/c. Also recommending Lisinopril for slightly elevated blood pressures. TOC has put in for PCP ([facility]).   Left perineal abscess and significant cellulitis from soft tissue infection - S/p Incision and debridement/drainage of left perineal abscess w/ Penrose through the wound as well as Kerlix packing - Dr. Kae Heller - 07/04/2019  - POD #2 -  Packing removed POD #1. Penrose to stay in place - Start sitz baths. Patient may shower  - Cellulitis improving  - Continue IV abx while inpateint. Await Cx's s/s - Mobilize - Pulm toilet   FEN - CM diet, IVF VTE - SCDs, Lovenox ID - Zosyn 3/14 >>  Plan: Up to shower this morning. Patient voiding well, tolerating diet, ambulating well, pain well controlled, vital signs stable, cellulitis improving. Will discuss with MD about possible d/c this am. Spoke with [facility] pharmacy who recommended doxy based on current cultures.     LOS: 1 day    Jillyn Ledger , [facility] 07/06/2019, 8:22 AM Please see Amion for pager number during day hours 7:00am-4:30pm

## 2019-07-06 NOTE — Discharge Summary (Signed)
[facility] Discharge Summary   Patient ID: Cody Thompson MRN: [id number] DOB/AGE: 47/16/1972 47 y.o.  Admit date: 07/04/2019 Discharge date: 07/06/2019  Admitting Diagnosis: Left perineal abscess and significant cellulitis from soft tissue infection  Discharge Diagnosis Patient Active Problem List   Diagnosis Date Noted  . Diabetes mellitus type 2, uncontrolled ([facility]) 07/04/2019  . Abscess of groin, left 07/04/2019    Consultants Internal medicine  Imaging: CT ABDOMEN PELVIS W CONTRAST  Result Date: 07/04/2019 CLINICAL DATA:  Worsening left groin abscess. EXAM: CT ABDOMEN AND PELVIS WITH CONTRAST TECHNIQUE: Multidetector CT imaging of the abdomen and pelvis was performed using the standard protocol following bolus administration of intravenous contrast. CONTRAST:  OMNIPAQUE IOHEXOL 300 MG/ML  SOLN COMPARISON:  None. FINDINGS: Lower chest: No significant pulmonary nodules or acute consolidative airspace disease. Hepatobiliary: Normal liver size. No liver mass. Normal gallbladder with no radiopaque cholelithiasis. No biliary ductal dilatation. Pancreas: Normal, with no mass or duct dilation. Spleen: Normal size. No mass. Adrenals/Urinary Tract: Normal adrenals. Subcentimeter hypodense renal cortical lesion in the posterior lower right kidney, too small to characterize, requiring no follow-up. Otherwise normal kidneys, with no hydronephrosis. Normal bladder. Stomach/Bowel: Normal non-distended stomach. Normal caliber small bowel with no small bowel wall thickening. Normal appendix. Mild left colonic diverticulosis, with no large bowel wall thickening or significant pericolonic fat stranding. Vascular/Lymphatic: Normal caliber abdominal aorta. Patent portal, splenic, hepatic and renal veins. No pathologically enlarged abdominal lymph nodes. Mildly enlarged 1.0 cm left external iliac node (series 3/image 80). Mild left inguinal lymphadenopathy up to 1.3 cm (series 3/image 91). No  right pelvic adenopathy. Reproductive: Normal size prostate with nonspecific punctate internal prostatic calcification. Other: No pneumoperitoneum. No ascites. Subcutaneous anterior left perineal 4.6 x 2.0 x 4.0 cm abscess with thick enhancing wall and prominent surrounding fat stranding and associated skin thickening (series 3/image 107), with subcutaneous edema extending inferiorly into the medial left thigh. No definite soft tissue gas. Soft tissue edema appears to involve the fascia of the left adductor musculature in the medial left thigh (series 3/image 112). Musculoskeletal: No aggressive appearing focal osseous lesions. IMPRESSION: 1. Subcutaneous anterior left perineal 4.6 x 2.0 x 4.0 cm abscess with prominent surrounding cellulitis extending into the medial left thigh with apparent involvement of the fascia of the left adductor musculature in the medial left thigh suggesting fasciitis. No definite soft tissue gas to suggest necrotizing fasciitis. 2. Mild left colonic diverticulosis. Electronically Signed   By: Delbert Phenix M.D.   On: 07/04/2019 17:46    Procedures Dr. Fredricka Bonine (07/04/2019) - Incision and debridement/drainage of left perineal abscess   Hospital Course:  Cody Thompson is a 49yo male who presented to [facility] 3/14 with 2 days of swelling and pain in the left groin.  He had a CT scan that revealed left perineal abscess with significant cellulitis.  Patient was admitted and underwent procedure listed above.  Tolerated procedure well and was transferred to the floor.  He was kept on IV zosyn during admission and transitioned to doxycycline at discharge. A1c noted to be elevated at 8.8 this admission therefore internal medicine was consulted for assistance with management on new onset diabetes. On POD2, the patient was tolerating diet, ambulating well, pain well controlled, vital signs stable and felt stable for discharge home.  Patient will follow up as below and knows to call with questions or  concerns.    I have personally reviewed the patients medication history on the [place] controlled substance database.  Allergies as of 07/06/2019   No Known Allergies     Medication List    STOP taking these medications   ibuprofen 200 MG tablet Commonly known as: ADVIL   Pseudoephedrine-Guaifenesin [phone] MG Tb12 Commonly known as: Mucinex D     TAKE these medications   acetaminophen 500 MG tablet Commonly known as: TYLENOL Take 2 tablets (1,000 mg total) by mouth every 6 (six) hours.   atorvastatin 20 MG tablet Commonly known as: LIPITOR Take 1 tablet (20 mg total) by mouth daily at 6 PM. Start taking on: July 07, 2019   docusate sodium 100 MG capsule Commonly known as: COLACE Take 1 capsule (100 mg total) by mouth 2 (two) times daily as needed for mild constipation.   doxycycline 100 MG capsule Commonly known as: VIBRAMYCIN Take 1 capsule (100 mg total) by mouth 2 (two) times daily.   fluticasone 50 MCG/ACT nasal spray Commonly known as: FLONASE Place 2 sprays into the nose daily.   glipiZIDE 5 MG tablet Commonly known as: GLUCOTROL Take 1 tablet (5 mg total) by mouth daily before breakfast.   lisinopril 5 MG tablet Commonly known as: ZESTRIL Take 1 tablet (5 mg total) by mouth daily. Start taking on: July 07, 2019   metFORMIN 500 MG tablet Commonly known as: Glucophage Take 1 tablet (500 mg total) by mouth 2 (two) times daily with a meal.   oxyCODONE 5 MG immediate release tablet Commonly known as: Oxy IR/ROXICODONE Take 1 tablet (5 mg total) by mouth every 6 (six) hours as needed for breakthrough pain.   polyethylene glycol 17 g packet Commonly known as: MIRALAX / GLYCOLAX Take 17 g by mouth daily as needed.        Follow-up Information    [facility]. Go on 08/02/2019.   Why: 2:30pm appointment, please wear a mask.  Contact information: [place] [id number] [phone]        [facility], [place]. Go on 07/20/2019.   Specialty: General Surgery Why: at 10am. Please arrive 30 minutes prior to your appointment for paperwork. Please bring a copy of your photo ID and insurance card.  Contact information: [address] [place] [address] [phone]           Signed: [place], [facility] 07/06/2019, 2:47 PM Please see Amion for pager number during day hours 7:00am-4:30pm

## 2019-07-06 NOTE — Progress Notes (Signed)
[facility] CONSULT PROGRESS NOTE    Progress Note  Cody Thompson  GYB:[id number] DOB: 1971/02/05 DOA: 07/04/2019 PCP: Patient, No Pcp Per     Brief Narrative:   Cody Thompson is an 49 y.o. male past medical history of diabetes mellitus type 2 comes in for swelling and pain in the left groin he manually open abscess 1 day prior to admission and a small amount of clear fluid but the swelling and the redness continued to extend to the left thigh. Assessment/Plan:   Left perineal abscess/cellulitis of the left gluteus: Status post I&D on 07/04/2019 Penrose drain in place. Culture data is pending, continue IV empiric antibiotics per primary team.  New onset Diabetes mellitus type 2, uncontrolled: With an A1c of 8.8.  Patient has no PCP. He was started on sliding scale insulin, his blood glucose remains elevated. We will start him on long-acting insulin continue CBGs before meals and at bedtime. If there is no further surgical intervention or no need for further imaging. We will recommend changing him to Metformin 500 mg p.o. twice daily and glipizide 5 mg daily, he can continue these as an outpatient.  Pseudohyponatremia Likely due to hyperglycemia, resolved with correction of blood glucose.  Dyslipidemia: Start him on Lipitor.  DVT prophylaxis: lovenox Family Communication:none Disposition Plan/Barrier to D/C:   Code Status:     Code Status Orders  (From admission, onward)         Start     Ordered   07/04/19 2130  Full code  Continuous     07/04/19 2129        Code Status History    This patient has a current code status but no historical code status.   Advance Care Planning Activity        IV Access:    Peripheral IV   Procedures and diagnostic studies:   CT ABDOMEN PELVIS W CONTRAST  Result Date: 07/04/2019 CLINICAL DATA:  Worsening left groin abscess. EXAM: CT ABDOMEN AND PELVIS WITH CONTRAST TECHNIQUE: Multidetector CT imaging of the abdomen  and pelvis was performed using the standard protocol following bolus administration of intravenous contrast. CONTRAST:  OMNIPAQUE IOHEXOL 300 MG/ML  SOLN COMPARISON:  None. FINDINGS: Lower chest: No significant pulmonary nodules or acute consolidative airspace disease. Hepatobiliary: Normal liver size. No liver mass. Normal gallbladder with no radiopaque cholelithiasis. No biliary ductal dilatation. Pancreas: Normal, with no mass or duct dilation. Spleen: Normal size. No mass. Adrenals/Urinary Tract: Normal adrenals. Subcentimeter hypodense renal cortical lesion in the posterior lower right kidney, too small to characterize, requiring no follow-up. Otherwise normal kidneys, with no hydronephrosis. Normal bladder. Stomach/Bowel: Normal non-distended stomach. Normal caliber small bowel with no small bowel wall thickening. Normal appendix. Mild left colonic diverticulosis, with no large bowel wall thickening or significant pericolonic fat stranding. Vascular/Lymphatic: Normal caliber abdominal aorta. Patent portal, splenic, hepatic and renal veins. No pathologically enlarged abdominal lymph nodes. Mildly enlarged 1.0 cm left external iliac node (series 3/image 80). Mild left inguinal lymphadenopathy up to 1.3 cm (series 3/image 91). No right pelvic adenopathy. Reproductive: Normal size prostate with nonspecific punctate internal prostatic calcification. Other: No pneumoperitoneum. No ascites. Subcutaneous anterior left perineal 4.6 x 2.0 x 4.0 cm abscess with thick enhancing wall and prominent surrounding fat stranding and associated skin thickening (series 3/image 107), with subcutaneous edema extending inferiorly into the medial left thigh. No definite soft tissue gas. Soft tissue edema appears to involve the fascia of the left adductor musculature in the medial left  thigh (series 3/image 112). Musculoskeletal: No aggressive appearing focal osseous lesions. IMPRESSION: 1. Subcutaneous anterior left perineal 4.6  x 2.0 x 4.0 cm abscess with prominent surrounding cellulitis extending into the medial left thigh with apparent involvement of the fascia of the left adductor musculature in the medial left thigh suggesting fasciitis. No definite soft tissue gas to suggest necrotizing fasciitis. 2. Mild left colonic diverticulosis. Electronically Signed   By: Delbert Phenix M.D.   On: 07/04/2019 17:46     Medical Consultants:    None.  Anti-Infectives:   IV zosyn  Subjective:    Cody Thompson has no new complaints.  Objective:    Vitals:   07/05/19 1448 07/05/19 2151 07/06/19 0418 07/06/19 0852  BP: 116/83 109/82 102/78 125/77  Pulse: 93 87 88   Resp: 20 17 17    Temp: 98.8 F (37.1 C) 98.4 F (36.9 C) 99.4 F (37.4 C)   TempSrc: Oral Oral Oral   SpO2: 95% 90% 100%   Weight:      Height:       SpO2: 100 % O2 Flow Rate (L/min): 2 L/min   Intake/Output Summary (Last 24 hours) at 07/06/2019 0952 Last data filed at 07/06/2019 0857 Gross per 24 hour  Intake 1879.87 ml  Output 2400 ml  Net -520.13 ml   Filed Weights   07/04/19 2020  Weight: 99.8 kg    Exam: General exam: In no acute distress. Respiratory system: Good air movement and clear to auscultation. Cardiovascular system: S1 & S2 heard, RRR. No JVD. Gastrointestinal system: Abdomen is nondistended, soft and nontender.  Central nervous system: Alert and oriented. No focal neurological deficits. Extremities: No pedal edema. Skin: No rashes, lesions or ulcers Psychiatry: Judgement and insight appear normal. Mood & affect appropriate. Data Reviewed:    Labs: Basic Metabolic Panel: Recent Labs  Lab 07/04/19 1624 07/05/19 0346  NA 132* 133*  K 4.3 4.3  CL 94* 95*  CO2 26 25  GLUCOSE 339* 239*  BUN 8 12  CREATININE 0.82 0.78  CALCIUM 8.9 8.2*   GFR Estimated Creatinine Clearance: 138.9 mL/min (by C-G formula based on SCr of 0.78 mg/dL). Liver Function Tests: Recent Labs  Lab 07/04/19 1624  AST 16  ALT 18    ALKPHOS 115  BILITOT 1.0  PROT 6.9  ALBUMIN 2.6*   No results for input(s): LIPASE, AMYLASE in the last 168 hours. No results for input(s): AMMONIA in the last 168 hours. Coagulation profile No results for input(s): INR, PROTIME in the last 168 hours. COVID-19 Labs  No results for input(s): DDIMER, FERRITIN, LDH, CRP in the last 72 hours.  Lab Results  Component Value Date   SARSCOV2NAA NEGATIVE 07/04/2019    CBC: Recent Labs  Lab 07/04/19 1624 07/05/19 0346  WBC 15.2* 13.9*  NEUTROABS 12.2*  --   HGB 15.2 13.3  HCT 44.0 39.4  MCV 98.9 100.8*  PLT 204 196   Cardiac Enzymes: No results for input(s): CKTOTAL, CKMB, CKMBINDEX, TROPONINI in the last 168 hours. BNP (last 3 results) No results for input(s): PROBNP in the last 8760 hours. CBG: Recent Labs  Lab 07/05/19 0816 07/05/19 1201 07/05/19 1727 07/05/19 2150 07/06/19 0758  GLUCAP 225* 168* 173* 200* 191*   D-Dimer: No results for input(s): DDIMER in the last 72 hours. Hgb A1c: Recent Labs    07/05/19 0346  HGBA1C 8.8*   Lipid Profile: Recent Labs    07/05/19 0346  CHOL 148  HDL 21*  LDLCALC 90  TRIG  183*  CHOLHDL 7.0   Thyroid function studies: No results for input(s): TSH, T4TOTAL, T3FREE, THYROIDAB in the last 72 hours.  Invalid input(s): FREET3 Anemia work up: No results for input(s): VITAMINB12, FOLATE, FERRITIN, TIBC, IRON, RETICCTPCT in the last 72 hours. Sepsis Labs: Recent Labs  Lab 07/04/19 1624 07/05/19 0346  WBC 15.2* 13.9*   Microbiology Recent Results (from the past 240 hour(s))  Respiratory Panel by RT PCR (Flu A&B, Covid) - Nasopharyngeal Swab     Status: None   Collection Time: 07/04/19  4:28 PM   Specimen: Nasopharyngeal Swab  Result Value Ref Range Status   SARS Coronavirus 2 by RT PCR NEGATIVE NEGATIVE Final    Comment: (NOTE) SARS-CoV-2 target nucleic acids are NOT DETECTED. The SARS-CoV-2 RNA is generally detectable in upper respiratoy specimens during the  acute phase of infection. The lowest concentration of SARS-CoV-2 viral copies this assay can detect is 131 copies/mL. A negative result does not preclude SARS-Cov-2 infection and should not be used as the sole basis for treatment or other patient management decisions. A negative result may occur with  improper specimen collection/handling, submission of specimen other than nasopharyngeal swab, presence of viral mutation(s) within the areas targeted by this assay, and inadequate number of viral copies (<131 copies/mL). A negative result must be combined with clinical observations, patient history, and epidemiological information. The expected result is Negative. Fact Sheet for Patients:  PinkCheek.be Fact Sheet for Healthcare Providers:  GravelBags.it This test is not yet ap proved or cleared by the [place] FDA and  has been authorized for detection and/or diagnosis of SARS-CoV-2 by FDA under an Emergency Use Authorization (EUA). This EUA will remain  in effect (meaning this test can be used) for the duration of the COVID-19 declaration under Section 564(b)(1) of the Act, 21 U.S.C. section 360bbb-3(b)(1), unless the authorization is terminated or revoked sooner.    Influenza A by PCR NEGATIVE NEGATIVE Final   Influenza B by PCR NEGATIVE NEGATIVE Final    Comment: (NOTE) The Xpert Xpress SARS-CoV-2/FLU/RSV assay is intended as an aid in  the diagnosis of influenza from Nasopharyngeal swab specimens and  should not be used as a sole basis for treatment. Nasal washings and  aspirates are unacceptable for Xpert Xpress SARS-CoV-2/FLU/RSV  testing. Fact Sheet for Patients: PinkCheek.be Fact Sheet for Healthcare Providers: GravelBags.it This test is not yet approved or cleared by the [place] FDA and  has been authorized for detection and/or diagnosis of SARS-CoV-2  by  FDA under an Emergency Use Authorization (EUA). This EUA will remain  in effect (meaning this test can be used) for the duration of the  Covid-19 declaration under Section 564(b)(1) of the Act, 21  U.S.C. section 360bbb-3(b)(1), unless the authorization is  terminated or revoked. Performed at [facility], [place] [address], [place], [place] [address]   Aerobic/Anaerobic Culture (surgical/deep wound)     Status: None (Preliminary result)   Collection Time: 07/04/19  8:04 PM   Specimen: Abscess  Result Value Ref Range Status   Specimen Description ABSCESS LEFT GROIN  Final   Special Requests   Final    NONE Performed at [facility], [place] [address], [place], [place] [address]    Gram Stain   Final    ABUNDANT WBC PRESENT,BOTH PMN AND MONONUCLEAR ABUNDANT GRAM POSITIVE COCCI    Culture   Final    ABUNDANT STAPHYLOCOCCUS AUREUS CULTURE REINCUBATED FOR BETTER GROWTH    Report Status PENDING  Incomplete     Medications:   . acetaminophen  1,000 mg Oral Q6H  . docusate sodium  100 mg Oral BID  . enoxaparin (LOVENOX) injection  40 mg Subcutaneous Q24H  . insulin aspart  0-15 Units Subcutaneous TID WC  . insulin aspart  0-5 Units Subcutaneous QHS  . lisinopril  5 mg Oral Daily  . polyethylene glycol  17 g Oral Daily   Continuous Infusions: . sodium chloride 10 mL/hr at 07/06/19 0215  . piperacillin-tazobactam (ZOSYN)  IV 3.375 g (07/06/19 0550)      LOS: 1 day   Marinda Elk  [facility]  07/06/2019, 9:52 AM

## 2019-07-09 ENCOUNTER — Encounter: Payer: Self-pay | Admitting: Anesthesiology

## 2019-07-09 NOTE — Anesthesia Preprocedure Evaluation (Signed)
Anesthesia Evaluation  Patient identified by MRN, date of birth, ID band Patient awake    Reviewed: Allergy & Precautions, NPO status , Patient's Chart, lab work & pertinent test results  Airway Mallampati: II  TM Distance: >3 FB Neck ROM: Full    Dental  (+) Dental Advisory Given   Pulmonary neg pulmonary ROS, neg recent URI,    breath sounds clear to auscultation       Cardiovascular negative cardio ROS   Rhythm:Regular     Neuro/Psych negative neurological ROS  negative psych ROS   GI/Hepatic negative GI ROS, Neg liver ROS,   Endo/Other  negative endocrine ROSdiabetes  Renal/GU negative Renal ROS     Musculoskeletal negative musculoskeletal ROS (+)   Abdominal   Peds  Hematology negative hematology ROS (+)   Anesthesia Other Findings   Reproductive/Obstetrics                             Anesthesia Physical Anesthesia Plan  ASA: I  Anesthesia Plan: General   Post-op Pain Management:    Induction: Intravenous  PONV Risk Score and Plan: 2 and Ondansetron and Dexamethasone  Airway Management Planned: Oral ETT and LMA  Additional Equipment: None  Intra-op Plan:   Post-operative Plan: Extubation in OR  Informed Consent: I have reviewed the patients History and Physical, chart, labs and discussed the procedure including the risks, benefits and alternatives for the proposed anesthesia with the patient or authorized representative who has indicated his/her understanding and acceptance.     Dental advisory given  Plan Discussed with: CRNA and Surgeon  Anesthesia Plan Comments:         Anesthesia Quick Evaluation

## 2019-07-09 NOTE — Anesthesia Postprocedure Evaluation (Signed)
Anesthesia Post Note  Patient: Laiken Nohr  Procedure(s) Performed: INCISION AND DRAINAGE  LEFT GROIN ABSCESS (Left )     Patient location during evaluation: PACU Anesthesia Type: General Level of consciousness: awake and alert Pain management: pain level controlled Vital Signs Assessment: post-procedure vital signs reviewed and stable Respiratory status: spontaneous breathing, nonlabored ventilation, respiratory function stable and patient connected to nasal cannula oxygen Cardiovascular status: blood pressure returned to baseline and stable Postop Assessment: no apparent nausea or vomiting Anesthetic complications: no    Last Vitals:  Vitals:   07/06/19 0852 07/06/19 1535  BP: 125/77 118/77  Pulse:  79  Resp:  18  Temp:  36.7 C  SpO2:  97%    Last Pain:  Vitals:   07/06/19 1535  TempSrc: Oral  PainSc:                  Tyrika Newman

## 2019-07-11 LAB — AEROBIC/ANAEROBIC CULTURE W GRAM STAIN (SURGICAL/DEEP WOUND)

## 2019-08-02 ENCOUNTER — Ambulatory Visit: Payer: Self-pay | Attending: Nurse Practitioner | Admitting: Nurse Practitioner

## 2019-08-02 ENCOUNTER — Other Ambulatory Visit: Payer: Self-pay

## 2020-11-30 IMAGING — CT CT ABD-PELV W/ CM
2 of 5 series · 15 of 46 positions shown, 17 images · IV contrast (APPLIED)
Comparison: None.

CLINICAL DATA: Worsening left groin abscess.

EXAM:
CT ABDOMEN AND PELVIS WITH CONTRAST
TECHNIQUE: Multidetector CT imaging of the abdomen and pelvis was performed
using the standard protocol following bolus administration of
intravenous contrast.
CONTRAST:  100mL OMNIPAQUE IOHEXOL 300 MG/ML  SOLN

[Series 3: abdomen 5.0 · axial · 0.83mm/px · z∈[-635,-40]mm · 12 of 135 slices shown, 14 images]
[im 8/135  soft-tissue]
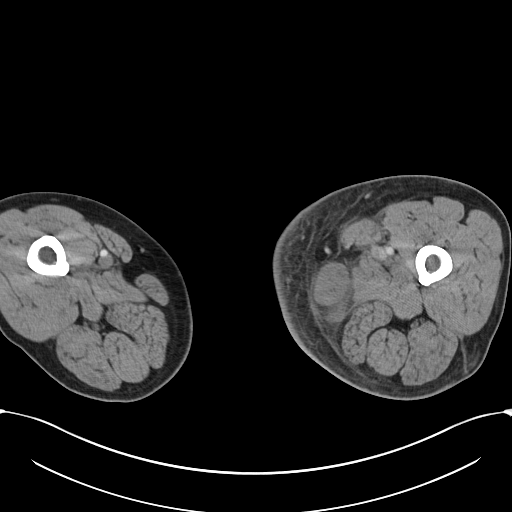
[im 8/135  bone]
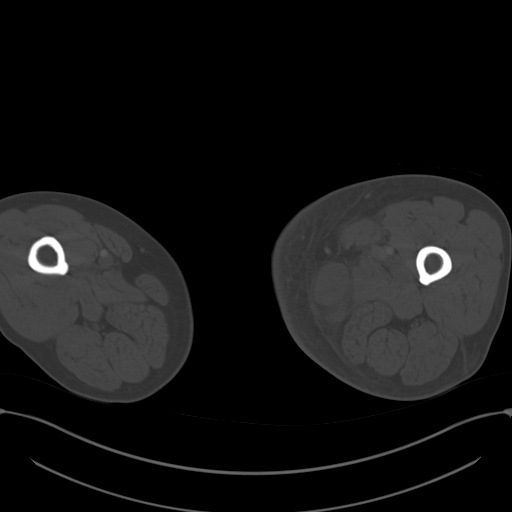
[im 24/135  soft-tissue]
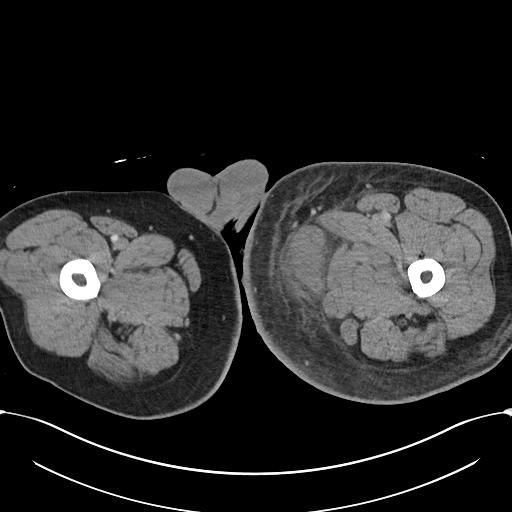
[im 32/135  soft-tissue]
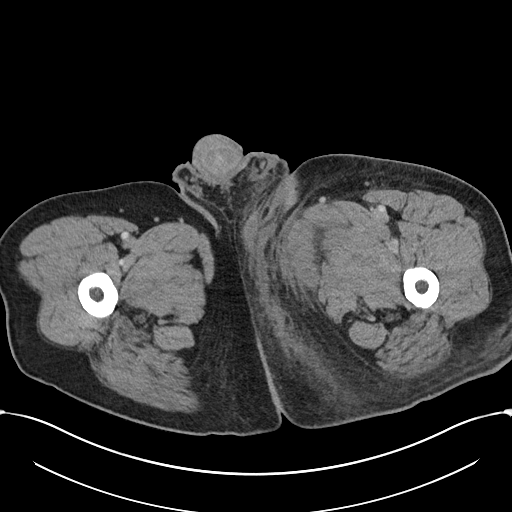
[im 40/135  soft-tissue]
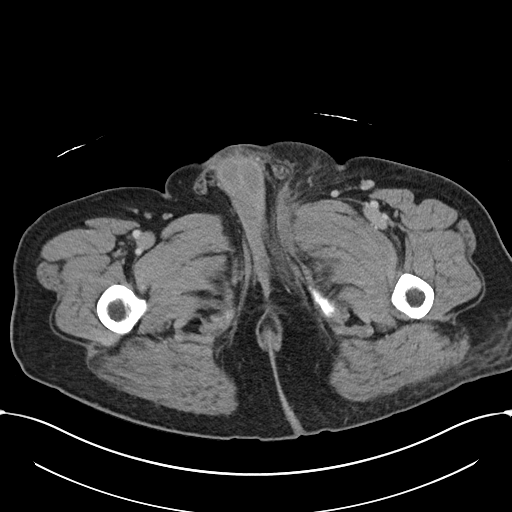
[im 56/135  soft-tissue]
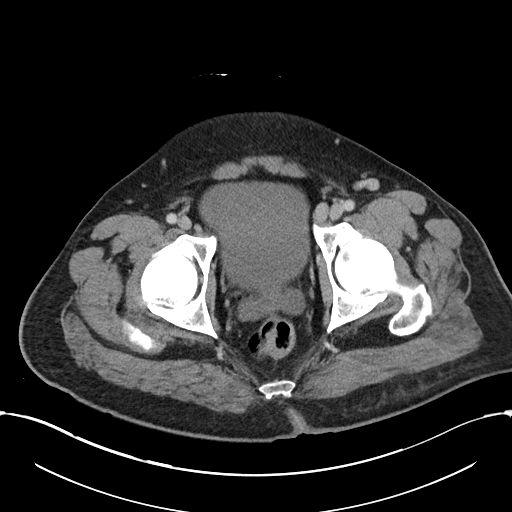
[im 64/135  soft-tissue]
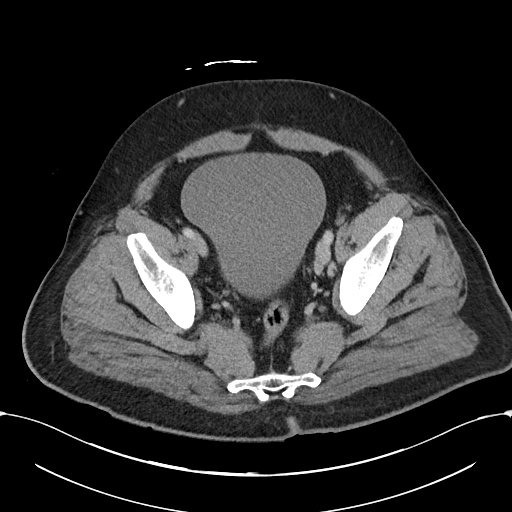
[im 71/135  soft-tissue]
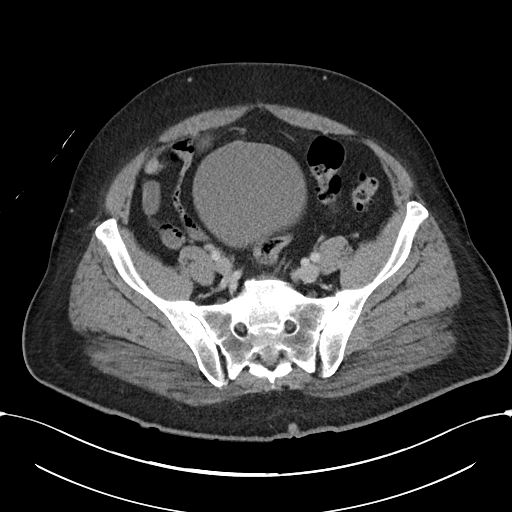
[im 87/135  soft-tissue]
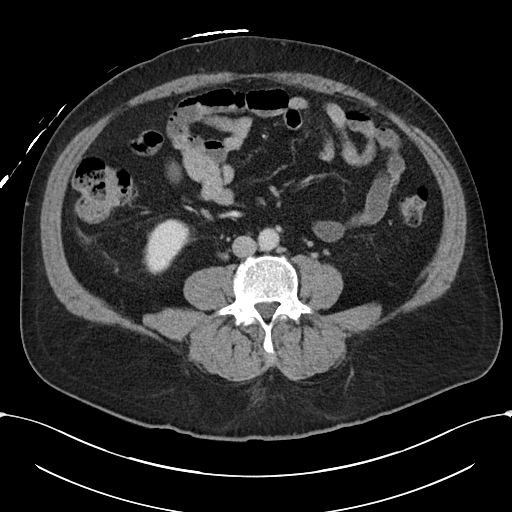
[im 95/135  soft-tissue]
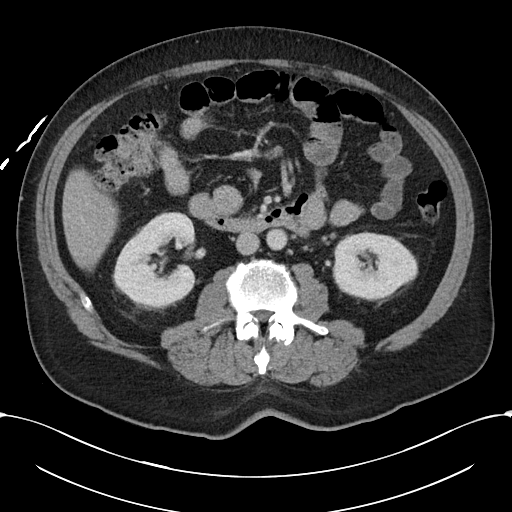
[im 95/135  bone]
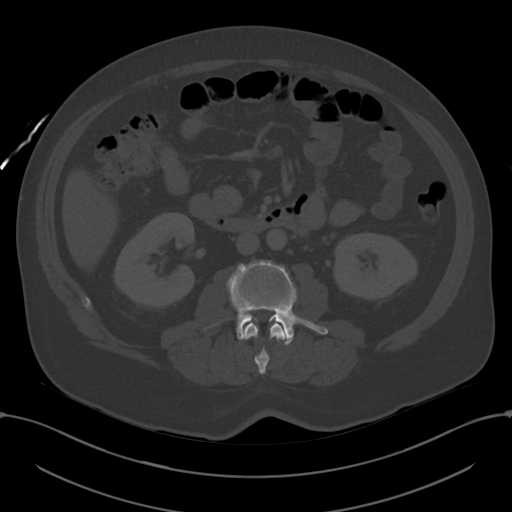
[im 103/135  soft-tissue]
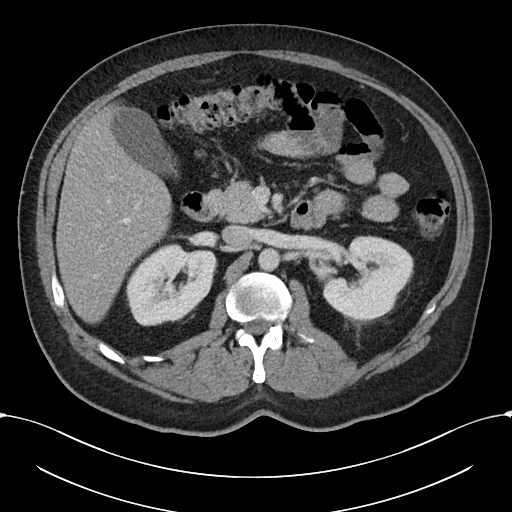
[im 119/135  soft-tissue]
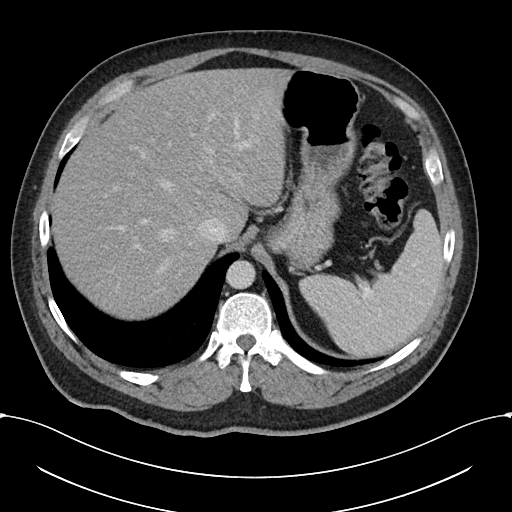
[im 127/135  soft-tissue]
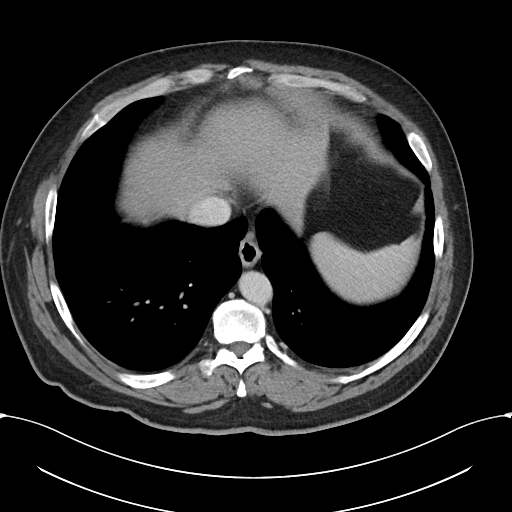

[Series 6: abdomen 3.0 mpr cor · coronal · 0.95mm/px · 3 of 110 slices shown]
[im 37/110  soft-tissue]
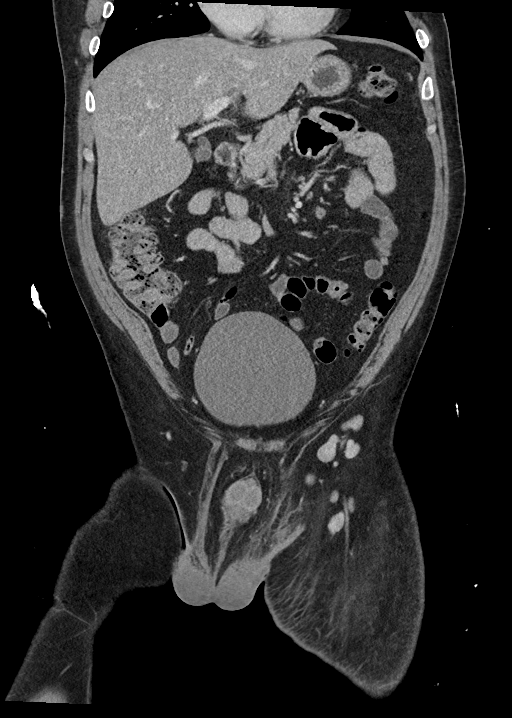
[im 49/110  soft-tissue]
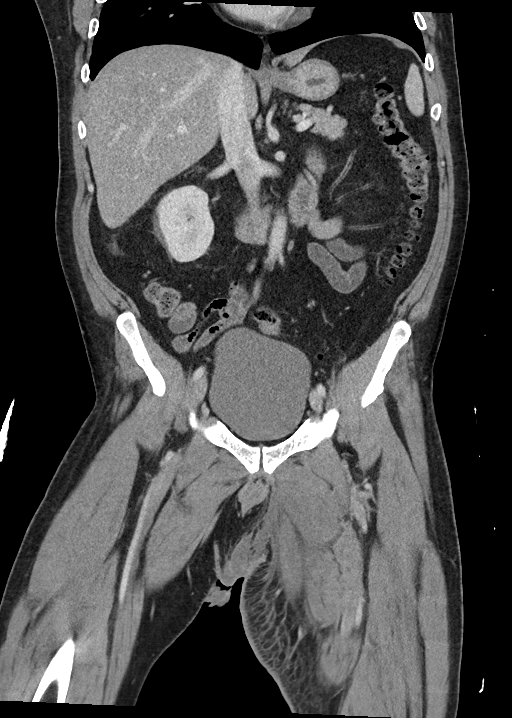
[im 61/110  soft-tissue]
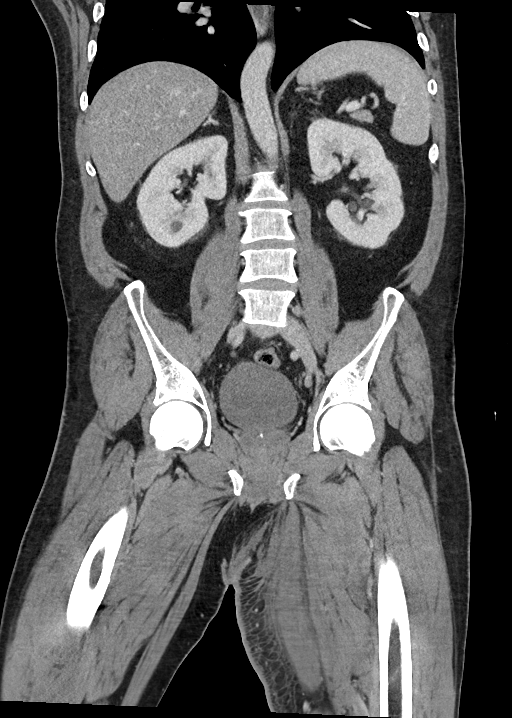

[15 of 46 positions shown; findings below may reference images not displayed]

FINDINGS: Lower chest: No significant pulmonary nodules or acute consolidative
airspace disease.

Hepatobiliary: Normal liver size. No liver mass. Normal gallbladder
with no radiopaque cholelithiasis. No biliary ductal dilatation.

Pancreas: Normal, with no mass or duct dilation.

Spleen: Normal size. No mass.

Adrenals/Urinary Tract: Normal adrenals. Subcentimeter hypodense
renal cortical lesion in the posterior lower right kidney, too small
to characterize, requiring no follow-up. Otherwise normal kidneys,
with no hydronephrosis. Normal bladder.

Stomach/Bowel: Normal non-distended stomach. Normal caliber small
bowel with no small bowel wall thickening. Normal appendix. Mild
left colonic diverticulosis, with no large bowel wall thickening or
significant pericolonic fat stranding.

Vascular/Lymphatic: Normal caliber abdominal aorta. Patent portal,
splenic, hepatic and renal veins. No pathologically enlarged
abdominal lymph nodes. Mildly enlarged 1.0 cm left external iliac
node (series 3/image 80). Mild left inguinal lymphadenopathy up to
1.3 cm (series 3/image 91). No right pelvic adenopathy.

Reproductive: Normal size prostate with nonspecific punctate
internal prostatic calcification.

Other: No pneumoperitoneum. No ascites. Subcutaneous anterior left
perineal 4.6 x 2.0 x 4.0 cm abscess with thick enhancing wall and
prominent surrounding fat stranding and associated skin thickening
(series 3/image 107), with subcutaneous edema extending inferiorly
into the medial left thigh. No definite soft tissue gas. Soft tissue
edema appears to involve the fascia of the left adductor musculature
in the medial left thigh (series 3/image 112).

Musculoskeletal: No aggressive appearing focal osseous lesions.
IMPRESSION: 1. Subcutaneous anterior left perineal 4.6 x 2.0 x 4.0 cm abscess
with prominent surrounding cellulitis extending into the medial left
thigh with apparent involvement of the fascia of the left adductor
musculature in the medial left thigh suggesting fasciitis. No
definite soft tissue gas to suggest necrotizing fasciitis.
2. Mild left colonic diverticulosis.
